# Patient Record
Sex: Male | Born: 1960 | Race: Black or African American | Hispanic: No | Smoking: Never smoker
Health system: Southern US, Community
[De-identification: ages and names within clinical notes are randomized; demographics above are authoritative.]

## PROBLEM LIST (undated history)

## (undated) DIAGNOSIS — I10 Essential (primary) hypertension: Secondary | ICD-10-CM

## (undated) DIAGNOSIS — E079 Disorder of thyroid, unspecified: Secondary | ICD-10-CM

## (undated) HISTORY — DX: Disorder of thyroid, unspecified: E07.9

## (undated) HISTORY — PX: KNEE SURGERY: SHX244

## (undated) HISTORY — DX: Essential (primary) hypertension: I10

## (undated) HISTORY — PX: HAND SURGERY: SHX662

## (undated) HISTORY — PX: HERNIA REPAIR: SHX51

---

## 2000-03-27 ENCOUNTER — Encounter: Payer: Self-pay | Admitting: Orthopedic Surgery

## 2000-03-27 ENCOUNTER — Ambulatory Visit (HOSPITAL_COMMUNITY): Admission: RE | Admit: 2000-03-27 | Discharge: 2000-03-27 | Payer: Self-pay | Admitting: Orthopedic Surgery

## 2000-04-12 ENCOUNTER — Encounter: Payer: Self-pay | Admitting: Orthopedic Surgery

## 2000-04-12 ENCOUNTER — Ambulatory Visit (HOSPITAL_COMMUNITY): Admission: RE | Admit: 2000-04-12 | Discharge: 2000-04-12 | Payer: Self-pay | Admitting: Orthopedic Surgery

## 2000-05-11 ENCOUNTER — Ambulatory Visit (HOSPITAL_COMMUNITY): Admission: RE | Admit: 2000-05-11 | Discharge: 2000-05-11 | Payer: Self-pay | Admitting: Orthopedic Surgery

## 2000-05-11 ENCOUNTER — Encounter: Payer: Self-pay | Admitting: Orthopedic Surgery

## 2002-07-31 ENCOUNTER — Encounter: Payer: Self-pay | Admitting: Neurosurgery

## 2002-07-31 ENCOUNTER — Ambulatory Visit (HOSPITAL_COMMUNITY): Admission: RE | Admit: 2002-07-31 | Discharge: 2002-07-31 | Payer: Self-pay | Admitting: Neurosurgery

## 2002-09-18 ENCOUNTER — Encounter
Admission: RE | Admit: 2002-09-18 | Discharge: 2002-11-05 | Payer: Self-pay | Admitting: Physical Medicine & Rehabilitation

## 2002-12-09 ENCOUNTER — Encounter
Admission: RE | Admit: 2002-12-09 | Discharge: 2003-03-09 | Payer: Self-pay | Admitting: Physical Medicine & Rehabilitation

## 2003-03-27 ENCOUNTER — Encounter
Admission: RE | Admit: 2003-03-27 | Discharge: 2003-06-25 | Payer: Self-pay | Admitting: Physical Medicine & Rehabilitation

## 2003-06-05 ENCOUNTER — Ambulatory Visit (HOSPITAL_BASED_OUTPATIENT_CLINIC_OR_DEPARTMENT_OTHER): Admission: RE | Admit: 2003-06-05 | Discharge: 2003-06-05 | Payer: Self-pay | Admitting: Orthopedic Surgery

## 2003-07-28 ENCOUNTER — Encounter
Admission: RE | Admit: 2003-07-28 | Discharge: 2003-10-26 | Payer: Self-pay | Admitting: Physical Medicine & Rehabilitation

## 2003-11-16 ENCOUNTER — Encounter
Admission: RE | Admit: 2003-11-16 | Discharge: 2004-02-14 | Payer: Self-pay | Admitting: Physical Medicine & Rehabilitation

## 2004-01-06 ENCOUNTER — Ambulatory Visit (HOSPITAL_COMMUNITY): Admission: RE | Admit: 2004-01-06 | Discharge: 2004-01-06 | Payer: Self-pay | Admitting: Anesthesiology

## 2004-02-11 ENCOUNTER — Encounter
Admission: RE | Admit: 2004-02-11 | Discharge: 2004-03-01 | Payer: Self-pay | Admitting: Physical Medicine & Rehabilitation

## 2004-03-01 ENCOUNTER — Encounter
Admission: RE | Admit: 2004-03-01 | Discharge: 2004-05-30 | Payer: Self-pay | Admitting: Physical Medicine & Rehabilitation

## 2004-03-03 ENCOUNTER — Ambulatory Visit: Payer: Self-pay | Admitting: Physical Medicine & Rehabilitation

## 2004-04-05 ENCOUNTER — Ambulatory Visit: Payer: Self-pay | Admitting: Physical Medicine & Rehabilitation

## 2004-05-05 ENCOUNTER — Encounter
Admission: RE | Admit: 2004-05-05 | Discharge: 2004-07-07 | Payer: Self-pay | Admitting: Physical Medicine & Rehabilitation

## 2004-06-29 ENCOUNTER — Encounter
Admission: RE | Admit: 2004-06-29 | Discharge: 2004-09-23 | Payer: Self-pay | Admitting: Physical Medicine & Rehabilitation

## 2004-08-16 ENCOUNTER — Encounter
Admission: RE | Admit: 2004-08-16 | Discharge: 2004-11-14 | Payer: Self-pay | Admitting: Physical Medicine & Rehabilitation

## 2004-08-31 ENCOUNTER — Ambulatory Visit: Payer: Self-pay | Admitting: Physical Medicine & Rehabilitation

## 2004-09-23 ENCOUNTER — Encounter
Admission: RE | Admit: 2004-09-23 | Discharge: 2004-12-22 | Payer: Self-pay | Admitting: Physical Medicine & Rehabilitation

## 2004-12-23 ENCOUNTER — Encounter
Admission: RE | Admit: 2004-12-23 | Discharge: 2005-03-23 | Payer: Self-pay | Admitting: Physical Medicine & Rehabilitation

## 2005-01-17 ENCOUNTER — Ambulatory Visit: Payer: Self-pay | Admitting: Anesthesiology

## 2005-05-01 ENCOUNTER — Encounter
Admission: RE | Admit: 2005-05-01 | Discharge: 2005-07-30 | Payer: Self-pay | Admitting: Physical Medicine & Rehabilitation

## 2005-05-01 ENCOUNTER — Ambulatory Visit: Payer: Self-pay | Admitting: Physical Medicine & Rehabilitation

## 2005-07-07 ENCOUNTER — Ambulatory Visit: Payer: Self-pay | Admitting: Physical Medicine & Rehabilitation

## 2005-08-02 ENCOUNTER — Encounter
Admission: RE | Admit: 2005-08-02 | Discharge: 2005-10-31 | Payer: Self-pay | Admitting: Physical Medicine & Rehabilitation

## 2005-09-04 ENCOUNTER — Ambulatory Visit: Payer: Self-pay | Admitting: Physical Medicine & Rehabilitation

## 2005-10-11 ENCOUNTER — Ambulatory Visit: Payer: Self-pay | Admitting: Physical Medicine & Rehabilitation

## 2005-10-11 ENCOUNTER — Encounter
Admission: RE | Admit: 2005-10-11 | Discharge: 2006-01-09 | Payer: Self-pay | Admitting: Physical Medicine & Rehabilitation

## 2005-11-30 ENCOUNTER — Ambulatory Visit: Payer: Self-pay | Admitting: Physical Medicine & Rehabilitation

## 2006-01-11 ENCOUNTER — Encounter
Admission: RE | Admit: 2006-01-11 | Discharge: 2006-04-11 | Payer: Self-pay | Admitting: Physical Medicine & Rehabilitation

## 2006-01-11 ENCOUNTER — Ambulatory Visit: Payer: Self-pay | Admitting: Physical Medicine & Rehabilitation

## 2006-03-08 ENCOUNTER — Ambulatory Visit: Payer: Self-pay | Admitting: Physical Medicine & Rehabilitation

## 2006-06-21 ENCOUNTER — Encounter
Admission: RE | Admit: 2006-06-21 | Discharge: 2006-09-19 | Payer: Self-pay | Admitting: Physical Medicine & Rehabilitation

## 2006-06-29 ENCOUNTER — Ambulatory Visit: Payer: Self-pay | Admitting: Physical Medicine & Rehabilitation

## 2006-07-30 ENCOUNTER — Ambulatory Visit: Payer: Self-pay | Admitting: Physical Medicine & Rehabilitation

## 2006-08-24 ENCOUNTER — Encounter
Admission: RE | Admit: 2006-08-24 | Discharge: 2006-11-16 | Payer: Self-pay | Admitting: Physical Medicine & Rehabilitation

## 2006-09-25 ENCOUNTER — Ambulatory Visit: Payer: Self-pay | Admitting: Physical Medicine & Rehabilitation

## 2006-11-16 ENCOUNTER — Encounter
Admission: RE | Admit: 2006-11-16 | Discharge: 2007-02-14 | Payer: Self-pay | Admitting: Physical Medicine & Rehabilitation

## 2006-11-22 ENCOUNTER — Ambulatory Visit: Payer: Self-pay | Admitting: Physical Medicine & Rehabilitation

## 2007-02-18 ENCOUNTER — Encounter
Admission: RE | Admit: 2007-02-18 | Discharge: 2007-03-20 | Payer: Self-pay | Admitting: Physical Medicine & Rehabilitation

## 2007-02-18 ENCOUNTER — Ambulatory Visit: Payer: Self-pay | Admitting: Physical Medicine & Rehabilitation

## 2007-05-09 ENCOUNTER — Ambulatory Visit: Payer: Self-pay | Admitting: Physical Medicine & Rehabilitation

## 2007-05-15 ENCOUNTER — Encounter
Admission: RE | Admit: 2007-05-15 | Discharge: 2007-05-15 | Payer: Self-pay | Admitting: Physical Medicine & Rehabilitation

## 2007-07-10 ENCOUNTER — Ambulatory Visit: Payer: Self-pay | Admitting: Physical Medicine & Rehabilitation

## 2007-07-11 ENCOUNTER — Encounter
Admission: RE | Admit: 2007-07-11 | Discharge: 2007-10-09 | Payer: Self-pay | Admitting: Physical Medicine & Rehabilitation

## 2007-09-06 ENCOUNTER — Ambulatory Visit: Payer: Self-pay | Admitting: Physical Medicine & Rehabilitation

## 2007-11-07 ENCOUNTER — Encounter
Admission: RE | Admit: 2007-11-07 | Discharge: 2007-11-11 | Payer: Self-pay | Admitting: Physical Medicine & Rehabilitation

## 2007-11-11 ENCOUNTER — Ambulatory Visit: Payer: Self-pay | Admitting: Physical Medicine & Rehabilitation

## 2007-12-11 ENCOUNTER — Encounter
Admission: RE | Admit: 2007-12-11 | Discharge: 2007-12-11 | Payer: Self-pay | Admitting: Physical Medicine & Rehabilitation

## 2007-12-11 ENCOUNTER — Ambulatory Visit: Payer: Self-pay | Admitting: Physical Medicine & Rehabilitation

## 2008-02-06 ENCOUNTER — Encounter
Admission: RE | Admit: 2008-02-06 | Discharge: 2008-02-10 | Payer: Self-pay | Admitting: Physical Medicine & Rehabilitation

## 2008-02-10 ENCOUNTER — Ambulatory Visit: Payer: Self-pay | Admitting: Physical Medicine & Rehabilitation

## 2008-03-11 ENCOUNTER — Encounter
Admission: RE | Admit: 2008-03-11 | Discharge: 2008-03-11 | Payer: Self-pay | Admitting: Physical Medicine & Rehabilitation

## 2008-03-11 ENCOUNTER — Ambulatory Visit: Payer: Self-pay | Admitting: Physical Medicine & Rehabilitation

## 2008-07-17 ENCOUNTER — Encounter
Admission: RE | Admit: 2008-07-17 | Discharge: 2008-10-15 | Payer: Self-pay | Admitting: Physical Medicine & Rehabilitation

## 2008-07-20 ENCOUNTER — Ambulatory Visit: Payer: Self-pay | Admitting: Physical Medicine & Rehabilitation

## 2008-11-06 ENCOUNTER — Encounter
Admission: RE | Admit: 2008-11-06 | Discharge: 2008-11-09 | Payer: Self-pay | Admitting: Physical Medicine & Rehabilitation

## 2008-11-09 ENCOUNTER — Ambulatory Visit: Payer: Self-pay | Admitting: Physical Medicine & Rehabilitation

## 2009-01-28 ENCOUNTER — Encounter
Admission: RE | Admit: 2009-01-28 | Discharge: 2009-04-28 | Payer: Self-pay | Admitting: Physical Medicine & Rehabilitation

## 2009-03-16 ENCOUNTER — Ambulatory Visit: Payer: Self-pay | Admitting: Physical Medicine & Rehabilitation

## 2009-07-12 ENCOUNTER — Encounter
Admission: RE | Admit: 2009-07-12 | Discharge: 2009-10-10 | Payer: Self-pay | Admitting: Physical Medicine & Rehabilitation

## 2009-09-01 ENCOUNTER — Ambulatory Visit: Payer: Self-pay | Admitting: Physical Medicine & Rehabilitation

## 2009-12-15 ENCOUNTER — Encounter
Admission: RE | Admit: 2009-12-15 | Discharge: 2009-12-22 | Payer: Self-pay | Admitting: Physical Medicine & Rehabilitation

## 2009-12-22 ENCOUNTER — Ambulatory Visit: Payer: Self-pay | Admitting: Physical Medicine & Rehabilitation

## 2010-03-11 ENCOUNTER — Encounter
Admission: RE | Admit: 2010-03-11 | Discharge: 2010-04-13 | Payer: Self-pay | Source: Home / Self Care | Attending: Physical Medicine & Rehabilitation | Admitting: Physical Medicine & Rehabilitation

## 2010-04-13 ENCOUNTER — Ambulatory Visit: Payer: Self-pay | Admitting: Physical Medicine & Rehabilitation

## 2010-08-30 ENCOUNTER — Ambulatory Visit: Payer: Self-pay | Admitting: Physical Medicine & Rehabilitation

## 2010-09-26 ENCOUNTER — Encounter: Payer: BC Managed Care – PPO | Attending: Physical Medicine & Rehabilitation

## 2010-09-26 ENCOUNTER — Ambulatory Visit: Payer: BC Managed Care – PPO | Admitting: Physical Medicine & Rehabilitation

## 2010-09-26 DIAGNOSIS — I10 Essential (primary) hypertension: Secondary | ICD-10-CM | POA: Insufficient documentation

## 2010-09-26 DIAGNOSIS — M545 Low back pain, unspecified: Secondary | ICD-10-CM | POA: Insufficient documentation

## 2010-09-26 DIAGNOSIS — M129 Arthropathy, unspecified: Secondary | ICD-10-CM | POA: Insufficient documentation

## 2010-09-26 DIAGNOSIS — M47814 Spondylosis without myelopathy or radiculopathy, thoracic region: Secondary | ICD-10-CM

## 2010-09-26 DIAGNOSIS — G8929 Other chronic pain: Secondary | ICD-10-CM | POA: Insufficient documentation

## 2010-09-26 DIAGNOSIS — M5380 Other specified dorsopathies, site unspecified: Secondary | ICD-10-CM

## 2010-09-26 DIAGNOSIS — IMO0002 Reserved for concepts with insufficient information to code with codable children: Secondary | ICD-10-CM

## 2010-10-31 NOTE — Assessment & Plan Note (Signed)
Phillip Merritt is back regarding his chronic low back pain.  Pain is about a 4- 6/10 and they think it is doing a bit better.  His biggest complaint is low back pain usually with walking.  He tries some low back activities. He works on his Pilates at [employer] and seems to be finding some relief with that.  He also has backed off his weight a bit at the gym.  He tells me that he is very unsatisfied with his job and feels that has lots of stress that effects him in a negative way.  REVIEW OF SYSTEMS:  Notable for the above.  Full 12-point review is written health and history section of the chart.  SOCIAL HISTORY:  Unchanged.  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  PHYSICAL EXAMINATION:  VITAL SIGNS:  Blood pressure is 167/96, pulse 70, respiratory rate 18, sating 100% on room air. GENERAL:  The patient is generally pleasant, alert and oriented x3.  He is well dressed.  Posture is good.  Strength is 5/5.  Normal reflexes and sensation throughout. BACK:  Range of motion is generally stable with pain more with extension than in flexion today.  HEART:  Regular. CHEST:  Clear. ABDOMEN:  Soft, nontender.  ASSESSMENT: 1. Lumbar facet arthropathy. 2. Hypertension.  PLAN: 1. Consider looking at gravity table for weight assisted traction for     low back.  He tells me that he has already looked at this to a     certain extent.  He can try some exercises on his own at the gym to     see how this affects his low back. 2. Keep with his exercises that he is doing.  He seemed to be     appropriate and he was protecting his back with these. 3. Again, we discussed weaning his hydrocodone.  I think our goal     would be to get 30-60 per month. 4. Also, discussed job satisfaction and I think this is likely playing     a large role in his continued pain complaints. 5. I will see the patient back in about 6 months' time.     Ranelle Oyster, M.D. Electronically  Signed    ZTS/MedQ D:  09/26/2010 12:41:23  T:  09/26/2010 22:25:18  Job #:  [id number]

## 2010-11-15 NOTE — Assessment & Plan Note (Signed)
Phillip Merritt is back regarding his back. He has been doing generally well. His  pain ranges from a 3 to 6 out of 10. Pain is most prominent when he is  active such as playing sports, etc. Pain is sharp, stabbing, and aching  when it bothers him. Pain is interfering with his general activities,  relationships with others, and enjoyment of life of an moderate level.  Sleep is fair.   REVIEW OF SYSTEMS:  Notable for the above.   SOCIAL HISTORY:  Without change. 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   PHYSICAL EXAMINATION:  Blood pressure is 127/86, pulse is 70,  respiratory rate is 18, he is sating at 99% in room air. Patient is  pleasant, alert and oriented x3. Affect is bright and appropriate. Gait  is stable. He has some pain still with extension and facet maneuvers in  the lumbar spine but is moving well. He is able to touch his toes today.  Muscle strength is 5 out of 5 and sensory exam is normal.  HEART: Regular.  CHEST: Clear.  ABDOMEN: Soft, nontender.   ASSESSMENT:  Lumbar facet arthropathy.   PLAN:  1. Continue exercise and range of motion as described.  2. Refill fentanyl patch 25 mcg for this month and next.  3. Refill hydrocodone 5/500 for this month and next.  4. He will see the nurse clinic in 2 months and me in 4 months time.      Ranelle Oyster, M.D.  Electronically Signed     ZTS/MedQ  D:  03/20/2007 12:56:30  T:  03/20/2007 16:06:44  Job #:  [id number]

## 2010-11-15 NOTE — Procedures (Signed)
NAME:  Phillip Merritt, Phillip Merritt                ACCOUNT NO.:  [id number]   MEDICAL RECORD NO.:  [id number]          PATIENT TYPE:  REC   LOCATION:  [facility]                          FACILITY:  [facility]   PHYSICIAN:  Erick Colace, M.D.DATE OF BIRTH:  07/04/1960   DATE OF PROCEDURE:  DATE OF DISCHARGE:                               OPERATIVE REPORT   PROCEDURES:  1. Left L3 and L4 medial branch radiofrequency neurotomy.  2. Left L5 dorsal ramus radiofrequency neurotomy under lumbar and      sacral fluoroscopic guidance.   INDICATIONS:  Lumbar spondylosis without myelopathy with facet mediated  pain.  He has had excellent response to repeat medial branch blocks  performed, Nov 11, 2007.  He really went down to zero pain.  He did have  prolonged response with radiofrequency neurotomy, last done  approximately 18 months ago.  Pain does interfere with activity at  moderate level.   The pain persists despite medication management including narcotic and  analgesic medications.   PROCEDURE:  Informed consent was obtained after describing risks and  benefits of the procedure with the patient.  These include bleeding,  bruising, and infection.  He elects to proceed and has given a written  consent.  The patient placed prone on fluoroscopy table.  Betadine prep,  sterilely drape. A 25-gauge 1-1/2 inch needle was used to anesthetize  the skin and subcutaneous tissue, 1% lidocaine x2 mL.  Then, a 20-gauge  10-cm RF needle with 10 mm curved active tip was inserted under  fluoroscopic guidance first starting at left S1 SAP sacroiliac junction.  Bone contact was made and confirmed with lateral imaging.  Sensory stim  at 50 Hz followed by motor stim at 2 Hz confirmed proper needle location  followed by injection of 1 mL of solution containing 1 mL of 4 mg  dexamethasone and 2 mL of 1% MPF lidocaine followed by radiofrequency  lesioning 70 degrees Celsius for 70 seconds into the left L5 SAP  transverse process  junction targeted.  Bone contact made and confirmed  with lateral imaging.  Sensory stim at 50 Hz followed by motor stim at 2  Hz confirmed proper needle location followed by injection of 1 mL of  dexamethasone-lidocaine solution and radiofrequency lesioning 70 degrees  Celsius for 70 seconds in the left L4 SAP transverse process junction  targeted.  Bone contact made and confirmed with lateral imaging.  Omnipaque 180 x 0.5 mL demonstrated no intravascular uptake and then 50  Hz stim followed by 2 Hz stim  confirmed proper needle location followed  by radiofrequency lesioning 70 degrees for 70 seconds.  The patient  tolerated the procedure well.  Pre- and postinjection vitals stable.  Post injection instructions given.  He will follow up with Dr. Riley Kill in  1 month.       Erick Colace, M.D.  Electronically Signed     AEK/MEDQ  D:  02/10/2008 17:21:22  T:  02/11/2008 06:10:42  Job:  [id number]

## 2010-11-15 NOTE — Assessment & Plan Note (Signed)
Phillip Merritt is back regarding his low back pain.  His pain seems to have  increased over the last few months to a 5-7/10.  Back is most painful on  the left lower lumbar segment without real radiation into the legs.  Pain is more bothersome when he is up and active on his feet, walking  and standing for prolonged periods of time.  He has continued to be  active with aerobic exercising and light weight lifting at the gym.  He  stays away from golf at this point as it seems to exacerbate his  symptoms greatly.  The pain is described as sharp and stabbing.  His  Fentanyl patch does not seem to cover pain as well as it once did.  He  uses hydrocodone for breakthrough pain.  Sleep is fair.   REVIEW OF SYSTEMS:  Notable for the above with full review in the  written health and history section of the chart.   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   PHYSICAL EXAMINATION:  Blood pressure is 127/78, pulse 74, respiratory  rate 18.  He is satting 98% on room air.  Patient is pleasant, alert and  oriented x3.  Affect is bright and appropriate.  Gait generally looks  stable.  He continues to have pain in the left lower lumbar segments,  particularly L4-L5 and L5-S1 with extension and left-sided facet  maneuvers.  He had good lumbar flexion today.  He was able to touch his  toes with minimal discomfort.  Strength is 5/5 both legs.  Sensation is  normal.  Reflexes are 2+.  HEART:  Regular.  CHEST:  Clear.  ABDOMEN:  Soft, nontender.  The patient's weight is generally stable.   ASSESSMENT:  Lumbar facet arthropathy.   PLAN:  1. It has been about two years since he last had his medial branch      block/RFs.  We will send him back to Dr. Wynn Banker for repeat L4-      L5, L5-S1 medial branch blocks.  2. Continue exercise and range of motion as we have discussed.  I      asked him to avoid a lot of axial loading and twisting with weight  lifting if possible.  3. Refill Fentanyl patch and hydrocodone.  4. I will see him back pending the above.      Ranelle Oyster, M.D.  Electronically Signed     ZTS/MedQ  D:  09/09/2007 12:33:37  T:  09/09/2007 20:18:19  Job #:  [id number]

## 2010-11-15 NOTE — Assessment & Plan Note (Signed)
Phillip Merritt is back regarding his back pain.  He has had great results with  the medial branch blocks.  He had 0/10 pain for a while.  His pain is at  4/10 now.  Described as aching and dull.  He uses his fentanyl patch and  hydrocodone for breakthrough symptoms.   REVIEW OF SYSTEMS:  Essentially is unchanged.  He denies any leg  symptoms.  Bowel and bladder functions are intact.   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   PHYSICAL EXAMINATION:  VITAL SIGNS:  Blood pressure is 136/94, pulse is  87, and respiratory rate 18.  He is saturating 99% on room air.  GENERAL:  The patient is a pleasant, alert, and oriented x3.  EXTREMITIES:  He has positive facet maneuvers, left and right today.  Good lumbar flexion ability noted today.  Strength is 5/5 with normal  sensation.  Overall, his back is less tender.  HEART:  Regular.  CHEST:  Clear.  ABDOMEN:  Soft, nontender.   ASSESSMENT:  Lumbar facet arthropathy.   PLAN:  1. Return to Dr. Wynn Banker for radiofrequency ablation, as he has had      excellent response to medial branch blocks.  2. Consider tapering off fentanyl over this summer, depending on      response to RF.  3. I refilled fentanyl patches 25 mcg and hydrocodone 5/500 today, #10      and 90 respectively.      Ranelle Oyster, M.D.  Electronically Signed     ZTS/MedQ  D:  12/11/2007 13:25:01  T:  12/12/2007 06:56:14  Job #:  [id number]

## 2010-11-15 NOTE — Assessment & Plan Note (Signed)
Phillip Merritt is doing about the same as related to his low back.  He has his  flares when he overdoes it with activity but generally has been doing  very well.  He continues to work full-time.  He rates his pain on  average of 4 out of 10 with increases to 7 out of 10 when he flares the  back.  The pain is described as sharp, constant, aching in the left low  back area.  The pain interferes with general activity, relations with  others and enjoyment of life on a moderate level.  Sleep is fair.   REVIEW OF SYSTEMS:  Is notable for the above.  Full review is in the  health and history section in chart.   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  Denies smoking or drinking.   PHYSICAL EXAMINATION:  Blood pressure is 145/94.  Pulse is 70,  respiratory rate 18, sating on 99% on room air.  The patient is generally pleasant, alert and oriented x3.  Affect is  bright and appropriate. His gait is stable.  Low back range of motion is unchanged with some pain in extension and  with facet maneuvers today.  HEART:  Regular.  CHEST:  Clear.  ABDOMEN:  Soft, nontender.  Motor function is 5/5.  Sensory exam is intact.   ASSESSMENT:  Lumbar facet arthropathy.   PLAN:  1. Continue exercise with stretching and strengthening. He is to      continue working on his core muscle strength.  I want to see him be      active.  If he knows he is going to overdo it or be involved with      something that is physically overtaxing he needs to consider      prophylaxing with an anti-inflammatory.  I recommended over-the-      counter naproxen 2 tablets q.12 hours to 8 hours surrounding such      activity.  2. Refill of fentanyl patch 25 mcg q.72 hours with a refill for next      month.  3. Hydrocodone 5/500 one q.8 hours p.r.n. with refill.  4. I will see the patient back in 4 months' time.  He will see the      nurse clinic in 2 months.      Ranelle Oyster, M.D.  Electronically Signed     ZTS/MedQ  D:  12/24/2006 13:02:07  T:  12/24/2006 15:26:01  Job #:  [id number]

## 2010-11-15 NOTE — Assessment & Plan Note (Signed)
Phillip Merritt is back regarding his back pain.  He has been doing fairly  well over the last few months.  He is off fentanyl currently.  He is  using 1 or 2 hydrocodone for breakthrough pain.  He tries to exercise  and stretch.  Pain is most prominent is his back and seems to be most  noticeable after prolonged standing, walking, or sitting.  He rates the  pain at 4-5/10.  He had lumbar RFs in August 2009.   REVIEW OF SYSTEMS:  Notable for the above.  Full review is in the  written health and history section of the chart.   SOCIAL HISTORY:  Unchanged and he is still working full-time.   PHYSICAL EXAMINATION:  VITAL SIGNS:  Blood pressure is 165/103, pulse  80, respiratory rate 18.  He is satting 98% on room air.  GENERAL:  The patient is pleasant, alert, oriented x3.  EXTREMITIES:  Affect is bright and appropriate.  He had good flexion and  extension with minimal pain today.  He really had no pain with  provocative facet maneuvers.  Strength 5/5 in both legs with 2+ reflexes  to sensation.  HEART:  Regular.  CHEST:  Clear.  ABDOMEN:  Soft, nontender.   ASSESSMENT:  1. Lumbar facet arthropathy with good response to radiofrequency      ablation.  2. Hypertension.   PLAN:  1. Continue breakthrough hydrocodone use.  Encouraged modalities,      stretching, anti-inflammatory, etc.  2. Follow up with PCP regarding blood pressure.      Ranelle Oyster, M.D.  Electronically Signed     ZTS/MedQ  D:  07/20/2008 11:33:10  T:  07/21/2008 01:16:48  Job #:  [id number]

## 2010-11-15 NOTE — Assessment & Plan Note (Signed)
Phillip Merritt is back regarding his low back pain.  Pain is increased a bit  over the last few months to a 5-6/10.  He had a flare yesterday when he  was sitting around the house did not report any type of exacerbating  activity.  Pain is aching and sharp usually in the left and right low  back.  Pain interferes with general activity, in relations with others,  and enjoyment of life on a moderate level.  He does try to stay active  and went to the gym doing some stretching daily and some light lifting.   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  No other social changes are noted.   REVIEW OF SYSTEMS:  Notable for the above.  Full 14-point review is in  the written health and history section of the chart.   PHYSICAL EXAMINATION:  VITAL SIGNS:  Blood pressure is 148/93, pulse is  102, respiratory rate 16.  He is sating 98% on room air.  GENERAL:  The patient is pleasant, alert, and oriented x3.  EXTREMITIES:  He is able to bend and even touch his toes today.  He had  mild pain with facet maneuvers in right and left today.  Extension  caused some mild discomfort as well.  Strength remains 5/5 with normal  reflexes and range of motion in both lower extremities.  HEART:  Regular.  CHEST:  Clear.  ABDOMEN:  Soft, nontender.  Weight is stable.   ASSESSMENT:  1. Lumbar facet arthropathy.  2. Hypertension.   PLAN:  1. We talked about some prophylactic steps before and after activities      such as ibuprofen or any anti-inflammatory.  We also add Flexeril      for spasm p.r.n. for bad days.  Continues to keep up with      activities days where he has downtime.  I think regular aerobic      activity and stretching is imperative for him in the maintenance of      his back.  Held off on any imminent intervention for the back at      this point and see how he does with conservative management.  We      discussed some alternative techniques with  his exercising as well      that may help take some stress off his back.  2. I refilled his Vicodin 5/500 one q.6 h p.r.n. #90.  3. I will see him back in 3 months.       Ranelle Oyster, M.D.  Electronically Signed     ZTS/MedQ  D:  11/09/2008 09:47:31  T:  11/10/2008 00:07:59  Job #:  [id number]

## 2010-11-15 NOTE — Procedures (Signed)
NAME:  Phillip Merritt, Phillip Merritt                ACCOUNT NO.:  [id number]   MEDICAL RECORD NO.:  [id number]          PATIENT TYPE:  REC   LOCATION:  [facility]                          FACILITY:  [facility]   PHYSICIAN:  Erick Colace, M.D.DATE OF BIRTH:  Mar 12, 1961   DATE OF PROCEDURE:  11/11/2007  DATE OF DISCHARGE:                               OPERATIVE REPORT   PROCEDURE:  Bilateral L5 dorsal ramus injection, bilateral L4 medial  branch block, bilateral L3 medial branch block under fluoroscopic  guidance.   INDICATIONS:  Lumbar facet mediated pain.  He had about an 75-month  relief of back pain with lumbar RF at L4-5, L5-S1 levels.  He has  gradually increased pain over last six months.   Pain is only partially responsive to management and other conservative  care.  Pain medications include hydrocodone and Fentanyl patch.  Pain  interferes with activity including ambulation and bending.   Informed consent was obtained after describing risks and benefits of the  procedure with the patient.  These include bleeding, bruising,  infection, temporary or permanent paralysis, elects to proceed and has  given consent.  Patient placed prone on the fluoroscopy table.  Betadine  prep, sterile drape, 25-gauge inch and a half needle was used to  anesthetize skin and subcutaneous tissue 1% lidocaine x2 mL.  Then a 22-  gauge three and a half inch spinal needle was inserted under  fluoroscopic guidance, first targeting left S1 SAP sacral iliac  junction, bone contact made, confirmed with lateral imaging.  Omnipaque  180 x0.5 mL demonstrated no intravascular uptake.  Then 0.5 mL of a  solution containing 1 mL of 4 mg/mL dexamethasone and 2 mL 2% MPF  lidocaine were injected.  The left L5 SAP transverse process junction  targeted, bone contact made, confirmed with lateral imaging.  Omnipaque  180 x0.5 mL demonstrated no intravascular uptake.  Then 0.5 mL of the  dexamethasone lidocaine solution was injected.   Then the left L4 SAP  transverse process junction targeted, bone contact made, confirmed with  lateral imaging.  Omnipaque 180 x0.5 mL demonstrated no intravascular  uptake.  Then 0.5 mL dexamethasone lidocaine solution injected.  This  same procedure was repeated at corresponding levels on the right side  with same needle, injectate and technique.  The patient tolerated  procedure well.  Pre- and post injection vitals stable.   Pre-injection and post injection 4/10 pain.  He will follow up with Dr.  Riley Kill to see if pain improves over time.  If not  significantly better,  consider SI joint.  Diskogenic pain is also in the differential.      Erick Colace, M.D.  Electronically Signed     AEK/MEDQ  D:  11/11/2007 14:38:18  T:  11/11/2007 15:33:18  Job:  [id number]

## 2010-11-15 NOTE — Assessment & Plan Note (Signed)
Phillip Merritt is back regarding his chronic lumbar facet arthropathy.  He had  lumbar radiofrequency ablations by Dr. Wynn Banker last month and has had  great results.  He used to stretch out his fentanyl patch to every 4  days.  He is using less hydrocodone.  His pain today is 2/10.  He has  done some exercise, he has played a little bit of golf, etc.  He has to  watch that he does not overdue at the time but otherwise he is very  happy with the results.   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   REVIEW OF SYSTEMS:  Negative other than that listed above.  Full review  is in the written health and history section on the chart.   PHYSICAL EXAMINATION:  VITAL SIGNS:  Blood pressure is 158/85, pulse is  82, respiratory rate 18, and he is saturating 100% on room air.  GENERAL:  The patient is pleasant, alert and oriented x3.  Affect is  bright and appropriate.  Strength is 5/5 in all 4 extremities.  Coordination, reflexes, and sensation are normal.  HEART:  Regular.  CHEST:  Clear.  ABDOMEN:  Soft and nontender.  BACK:  Slightly tender with extension but minimally so.  He has good  flexion in the lumbar spine.  Weight and posture are good.   ASSESSMENT:  Lumbar facet arthropathy.  Responsive again to  radiofrequency ablation.   PLAN:  1. We will stop fentanyl patch as he is only taking every 4 days now.  2. We refill the hydrocodone 5/500 #90 with an intent of decreasing      usage of this as well.  I encourage heat, stretching, core muscle      strengthening, Tylenol and ibuprofen for pain relief.  3. I will see him back in about 4 months.      Ranelle Oyster, M.D.  Electronically Signed     ZTS/MedQ  D:  03/11/2008 13:04:45  T:  03/12/2008 02:07:09  Job #:  [id number]

## 2010-11-18 NOTE — Op Note (Signed)
NAME:  Phillip Merritt, Phillip Merritt                          ACCOUNT NO.:  [id number]   MEDICAL RECORD NO.:  [id number]                   PATIENT TYPE:  AMB   LOCATION:  DSC                                  FACILITY:  [facility]   PHYSICIAN:  Harvie Junior, M.D.                DATE OF BIRTH:  23-Aug-1960   DATE OF PROCEDURE:  06/05/2003  DATE OF DISCHARGE:  06/05/2003                                 OPERATIVE REPORT   PREOPERATIVE DIAGNOSIS:  Malunion left fourth metacarpal, nonunion left  fifth metacarpal.   POSTOPERATIVE DIAGNOSIS:  Malunion left fourth metacarpal, nonunion left  fifth metacarpal.   PROCEDURE:  1. Open reduction and internal fixation of left malunited fourth metacarpal.  2. Open reduction and internal fixation of left fifth nonunited metacarpal.   SURGEON:  Harvie Junior, M.D.   ASSISTANT:  Marshia Ly, P.A.   ANESTHESIA:  General.   BRIEF HISTORY:  He is a 50 year old male with a long history of having a  basilar fourth and fifth metacarpal fracture.  I ultimately saw him in the  office and talked about treatment including the possibility of operative  treatment.  I felt that this was conservative care.  He ultimately elected  to undergo closed treatment and this was performed.  Following this, the  patient finished closed treatment.  He had no rotational malalignment.  He  did have some angular deformity and was thought to be doing reasonably well.  Unfortunately, he continued to have significant pain in his fourth and fifth  metacarpal.  Ultimately because of continued complaints of pain and  increasing pain, a CT scan was obtained which showed that he had a nonunited  fifth metacarpal and a malunited fourth metacarpal.  We talked about  treatment options at that point but ultimately felt that the most  appropriate course of action given his continued pain was going to be a take  down of his nonunion of his fifth metacarpal and essentially an osteotomy of  his fourth  metacarpal to establish straight finger.  He is brought to the  operating room for these procedures.   DESCRIPTION OF PROCEDURE:  The patient was taken to the operating room and  after adequate anesthesia with general endotracheal, the patient was placed  supine on the operating table.  The left arm was prepped and draped in the  usual sterile fashion.  Following this, a linear incision was made between  the fourth and fifth metacarpals.  The subcutaneous tissue were taken down  to the level of the fourth and fifth metacarpal and through a window, the  fifth metacarpal was identified initially.  The area of nonunion could be  identified and this was taken down and could be easily reduced.  Through a  separate window with separate retraction of the extensor tendon and through  the periosteal window to address the fourth metacarpal, the fourth  metacarpal was  united, although malunited.  At this point, essentially a  wedge osteotomy was undertaken to reduce the angular deformity of the fourth  metacarpal.  Once this was undertaken, the Leibinger hand set was used and a  Symphony bone graft was mixed up on the back table.   At this point, attention was turned to the fourth metacarpal where the  fourth  metacarpal was addressed with Symphony bone grafting after freeing  up the edges of the osteotomy site and a Leibinger six hole T-plate was used  with three screws in the proximal fragment and three screws distally with a  single screw open.  Excellent fixation was achieved with the small titanium  plate.  Attention was turned to closing this fourth window over the  Leibinger plate and attention was turned to the fifth metacarpal.  At this  point, a separate Leibinger plate was attempted to be used but really I did  not think we could get purchase with the proximal screws and so the plate  was put away.  Attention was turned towards bone grafting.  The fifth  metacarpal holding the finger in a  reduced position, making sure that the  rotation alignment of the fourth and fifth metacarpal were OK and then we  pinned the fifth metacarpal with two 62 K-wires to hold this in place.  Once  this was achieved, the periosteal window was closed over the fifth  metacarpal.  The wound, at this point, was copiously irrigated and suctioned  dry.  The subcu was closed with 3-0 Vicryl and the skin with 3-0 Maxon pull  out suture.  Benzoin and Steri-Strips were applied.  A sterile compressive  dressing was applied as well as an ulnar gutter splint with the fingers at  70 degrees of flexion and the wrist in slight extension.  The tourniquet was  let down, total tourniquet time was approximately two hours.                                               Harvie Junior, M.D.    Ranae Plumber  D:  06/08/2003  T:  06/09/2003  Job:  [id number]

## 2010-11-18 NOTE — Assessment & Plan Note (Signed)
Phillip Merritt is back regarding his low back pain.  He tried to wean down his  Vicodin a bit over the summer and has been a bit better.  He using 2-3 a  day.  Pain ranges from 4-7/10.  He had been active playing golf and  trying to change the way he swings.  Cares for his back while he is  playing.  Pain is aching.  Pain in the left low back region without  radiation into the legs.   REVIEW OF SYSTEMS:  Notable for the above.  Full 14 point review is in  the written health and history section of the chart.   SOCIAL HISTORY:  Unchanged.  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   PHYSICAL EXAMINATION:  Blood pressure 150/92, pulse is 93, respiratory  rate is 16, and saturating 98% on room air.  The patient is pleasant,  alert, and oriented x3.  Flexion and extension are fairly functional  today.  He has some mild pain with extension.  Strength is 5/5 in both  legs with 2+ reflexes and normal sensation.  Heart is regular.  Chest is  clear.  Abdomen is soft and nontender.   ASSESSMENT:  1. Lumbar facet arthropathy.  2. Hypertension.   PLAN:  1. Vicodin for breakthrough pain 2-3 times a day with Flexeril for      spasm p.r.n.  2. Discussed appropriate techniques and prophylaxis again.  3. I will see him back in 4 months.      Ranelle Oyster, M.D.  Electronically Signed     ZTS/MedQ  D:  03/16/2009 12:55:16  T:  03/17/2009 05:44:28  Job #:  [id number]

## 2011-03-28 ENCOUNTER — Encounter: Payer: BC Managed Care – PPO | Admitting: Physical Medicine & Rehabilitation

## 2011-04-11 ENCOUNTER — Encounter: Payer: BC Managed Care – PPO | Attending: Neurosurgery | Admitting: Neurosurgery

## 2011-04-11 DIAGNOSIS — I10 Essential (primary) hypertension: Secondary | ICD-10-CM | POA: Insufficient documentation

## 2011-04-11 DIAGNOSIS — M545 Low back pain, unspecified: Secondary | ICD-10-CM | POA: Insufficient documentation

## 2011-04-11 DIAGNOSIS — M5380 Other specified dorsopathies, site unspecified: Secondary | ICD-10-CM

## 2011-04-11 DIAGNOSIS — M129 Arthropathy, unspecified: Secondary | ICD-10-CM | POA: Insufficient documentation

## 2011-04-11 DIAGNOSIS — G8929 Other chronic pain: Secondary | ICD-10-CM | POA: Insufficient documentation

## 2011-04-11 NOTE — Assessment & Plan Note (Signed)
This is a patient of Dr. Arlyss Gandy who is seen for chronic low back pain.  He reports no worsening of his condition.  He states everything is about the same.  He does still take about 2-3 hydrocodone a day and is aware that Dr. Riley Kill wanted him to wean, but actually he is using minimal amount I think at this point.  His average pain is 7.  It is a sharp to aching-type pain.  General activity level is 5-6.  Pain is worse during the day.  Sleep patterns are fair.  Pain is also walking, bending, sitting, standing, and activities.  Medication and injections help.  MOBILITY:  He is independent.  She does work full time.  REVIEW OF SYSTEMS:  Notable for difficulties described above, otherwise within normal limits.  His Oswestry score is 34.  No signs of aberrant behaviors.  Last UDS was a year ago.  We will obtain one of those today.  PAST MEDICAL HISTORY:  Unchanged.  SOCIAL HISTORY:  Unchanged.  FAMILY HISTORY:  Unchanged.  PHYSICAL EXAM:  His blood pressure is 161/106.  He needs to see PCP about that.  Pulse 87, respirations 18, O2 sats 98 on room air.  His motor strength is 5/5 in lower extremities.  Sensation is intact. Constitutionally, he is within normal limits.  He is alert and oriented x3.  He does have somewhat of a limp to his gait.  ASSESSMENT: 1. Lumbar facet arthropathy. 2. Hypertension.  Should see primary care physician as soon as     possible.  PLAN: 1. Refill Vicodin 5/500 one p.o. q.6-8 h. p.r.n., #80 with no refill. 2. Flexeril 10 mg 1 p.o. q.8 h. p.r.n., #30 with 4 refills.  Obtain     UDS today. 3. He will follow up here in 6 months.  His questions were encouraged     and answered.     Phillip Merritt L. Blima Dessert Electronically Signed    RLW/MedQ D:  04/11/2011 10:22:46  T:  04/11/2011 15:50:49  Job #:  [id number]

## 2011-05-08 ENCOUNTER — Ambulatory Visit: Payer: BC Managed Care – PPO | Admitting: Physical Medicine & Rehabilitation

## 2011-11-07 ENCOUNTER — Encounter: Payer: BC Managed Care – PPO | Admitting: Physical Medicine & Rehabilitation

## 2011-11-24 ENCOUNTER — Ambulatory Visit
Admission: RE | Admit: 2011-11-24 | Discharge: 2011-11-24 | Disposition: A | Discharge status: Home / Self Care | Payer: BC Managed Care – PPO | Source: Ambulatory Visit | Attending: Family Medicine | Admitting: Family Medicine

## 2011-11-24 ENCOUNTER — Other Ambulatory Visit: Payer: Self-pay | Admitting: Family Medicine

## 2011-11-24 DIAGNOSIS — M25579 Pain in unspecified ankle and joints of unspecified foot: Secondary | ICD-10-CM

## 2011-12-11 ENCOUNTER — Encounter: Payer: Self-pay | Admitting: Physical Medicine and Rehabilitation

## 2011-12-11 ENCOUNTER — Encounter
Payer: BC Managed Care – PPO | Attending: Physical Medicine & Rehabilitation | Admitting: Physical Medicine and Rehabilitation

## 2011-12-11 ENCOUNTER — Ambulatory Visit: Payer: BC Managed Care – PPO | Admitting: Physical Medicine & Rehabilitation

## 2011-12-11 VITALS — BP 174/116 | HR 95 | Resp 16 | Ht 71.0 in | Wt 232.0 lb

## 2011-12-11 DIAGNOSIS — M545 Low back pain, unspecified: Secondary | ICD-10-CM | POA: Insufficient documentation

## 2011-12-11 DIAGNOSIS — G8929 Other chronic pain: Secondary | ICD-10-CM | POA: Insufficient documentation

## 2011-12-11 DIAGNOSIS — M79605 Pain in left leg: Secondary | ICD-10-CM

## 2011-12-11 DIAGNOSIS — E079 Disorder of thyroid, unspecified: Secondary | ICD-10-CM | POA: Insufficient documentation

## 2011-12-11 NOTE — Progress Notes (Signed)
Subjective:    Patient ID: Phillip Merritt, male    DOB: 02-03-1961, 51 y.o.   MRN: [id number]  HPI The patient complains about mild intermittend chronic low back pain,  which radiates into his left posterior thigh, intermittendly. The patient denies any radiation.  The problem has been stable. The patient reports that he works out regularly. Pain Inventory Average Pain 6 Pain Right Now 5 My pain is sharp and burning  In the last 24 hours, has pain interfered with the following? General activity 5 Relation with others 5 Enjoyment of life 5 What TIME of day is your pain at its worst? daytime Sleep (in general) Fair  Pain is worse with: walking, bending, sitting, standing and some activites Pain improves with: injections Relief from Meds: N/A  Mobility Do you have any goals in this area?  no  Function employed # of hrs/week 40  Neuro/Psych No problems in this area  Prior Studies Any changes since last visit?  no  Physicians involved in your care Any changes since last visit?  no   Family History  Problem Relation Age of Onset  . Diabetes Father    History   Social History  . Marital Status: Divorced    Spouse Name: N/A    Number of Children: N/A  . Years of Education: N/A   Social History Main Topics  . Smoking status: Never Smoker   . Smokeless tobacco: None  . Alcohol Use: None  . Drug Use: None  . Sexually Active: None   Other Topics Concern  . None   Social History Narrative  . None   Past Surgical History  Procedure Date  . Hand surgery   . Hernia repair   . Knee surgery    Past Medical History  Diagnosis Date  . Thyroid disease    BP 174/116  Pulse 95  Resp 16  Ht 5\' 11"  (1.803 m)  Wt 232 lb (105.235 kg)  BMI 32.36 kg/m2  SpO2 97%      Review of Systems  Constitutional: Negative.   HENT: Negative.   Eyes: Negative.   Respiratory: Negative.   Cardiovascular: Negative.   Gastrointestinal: Negative.   Genitourinary:  Negative.   Musculoskeletal: Positive for back pain.  Skin: Negative.   Neurological: Negative.   Hematological: Negative.   Psychiatric/Behavioral: Negative.        Objective:   Physical Exam  Constitutional: He is oriented to person, place, and time. He appears well-developed and well-nourished.  HENT:  Head: Normocephalic.  Eyes: Pupils are equal, round, and reactive to light.  Neck: Normal range of motion.  Musculoskeletal: Normal range of motion. He exhibits tenderness.  Neurological: He is alert and oriented to person, place, and time.  Skin: Skin is warm and dry.  Psychiatric: He has a normal mood and affect.    Symmetric normal motor tone is noted throughout. Normal muscle bulk. Muscle testing reveals 5/5 muscle strength of the upper extremity, and 5/5 of the lower extremity. Full range of motion in upper and lower extremities. ROM of spine is not restricted. Fine motor movements are normal in both hands. DTR in the upper and lower extremity are present and symmetric 2+. No clonus is noted.  Patient arises from chair without difficulty. Narrow based gait with normal arm swing bilateral , able to walk on heels and toes . Tandem walk is stable. No pronator drift. Rhomberg negative       Assessment & Plan:  Chronic LBP, radiating  into left posterior thigh, to the knee. Patient should continue with his exercising, I advised him to also strengthen his core muscles. The patient is not taking any pain medication. He should followup in 6 months or earlier if necessary.

## 2011-12-11 NOTE — Patient Instructions (Signed)
Continue with exercising, continue with medication 

## 2011-12-20 ENCOUNTER — Telehealth: Payer: Self-pay | Admitting: Physical Medicine & Rehabilitation

## 2011-12-20 NOTE — Telephone Encounter (Signed)
Would like to go back on muscle relaxer and Vicodin.  Also would like to discuss RF.

## 2011-12-20 NOTE — Telephone Encounter (Signed)
Please advise. You saw the patient back on 12/11/11.

## 2011-12-21 MED ORDER — MELOXICAM 7.5 MG PO TABS: 7.5000 mg | ORAL_TABLET | Freq: Every day | ORAL | Status: DC

## 2011-12-21 MED ORDER — METHOCARBAMOL 500 MG PO TABS: 500.0000 mg | ORAL_TABLET | Freq: Three times a day (TID) | ORAL | Status: AC

## 2011-12-21 NOTE — Telephone Encounter (Signed)
Patient can get robaxin 500mg  2-3 times per day, and Mobic 7.5 mg 1 tablet per day.if this regimen is not working might consider narcotics, if consistent UDS

## 2011-12-21 NOTE — Telephone Encounter (Signed)
Pt aware that meds have been sent in but vicodin was not. After UDS is back we will consider prescribing.

## 2011-12-26 ENCOUNTER — Telehealth: Payer: Self-pay | Admitting: *Deleted

## 2011-12-26 NOTE — Telephone Encounter (Signed)
Refill- Hydrocodone

## 2012-06-11 ENCOUNTER — Encounter
Payer: BC Managed Care – PPO | Attending: Physical Medicine & Rehabilitation | Admitting: Physical Medicine & Rehabilitation

## 2012-06-20 ENCOUNTER — Other Ambulatory Visit: Payer: Self-pay | Admitting: Physical Medicine and Rehabilitation

## 2012-07-22 ENCOUNTER — Encounter: Payer: Self-pay | Admitting: Physical Medicine & Rehabilitation

## 2012-07-22 ENCOUNTER — Ambulatory Visit
Admission: RE | Admit: 2012-07-22 | Discharge: 2012-07-22 | Disposition: A | Discharge status: Home / Self Care | Payer: BC Managed Care – PPO | Source: Ambulatory Visit | Attending: Physical Medicine & Rehabilitation | Admitting: Physical Medicine & Rehabilitation

## 2012-07-22 ENCOUNTER — Encounter
Payer: BC Managed Care – PPO | Attending: Physical Medicine & Rehabilitation | Admitting: Physical Medicine & Rehabilitation

## 2012-07-22 VITALS — BP 139/99 | HR 110 | Resp 16 | Ht 71.0 in | Wt 237.8 lb

## 2012-07-22 DIAGNOSIS — M47817 Spondylosis without myelopathy or radiculopathy, lumbosacral region: Secondary | ICD-10-CM | POA: Insufficient documentation

## 2012-07-22 DIAGNOSIS — M47816 Spondylosis without myelopathy or radiculopathy, lumbar region: Secondary | ICD-10-CM

## 2012-07-22 DIAGNOSIS — I10 Essential (primary) hypertension: Secondary | ICD-10-CM

## 2012-07-22 MED ORDER — NAPROXEN 500 MG PO TABS: 500.0000 mg | ORAL_TABLET | Freq: Two times a day (BID) | ORAL | Status: DC

## 2012-07-22 MED ORDER — METHOCARBAMOL 500 MG PO TABS: 500.0000 mg | ORAL_TABLET | Freq: Four times a day (QID) | ORAL | Status: DC | PRN

## 2012-07-22 NOTE — Progress Notes (Signed)
Subjective:    Patient ID: [name], male    DOB: 1961-06-01, 52 y.o.   MRN: [id number]  HPI  Phillip Merritt is here in follow up of his low back pain. At christmas time he bent over to pick up a box and had increased onset of low back on the left side. Nothing has improved the pain. He has tried ice, heat, stretching, but none of those worked. He has been out of mobic but tried some advil 400mg  q6 but it's not doing much either. Pain usually only radiates to the left buttock. There are no symptoms in the left leg/foot. Sitting seems to make the pain the worst. He can only sit for about 15 minutes before the symptoms kick in. It feels a little better when he lays down.   He has been out of work since April. His position was moved to [place], and he chose not to move there. He states that he hasn't been exercising much. 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   Pain Inventory Average Pain 8 Pain Right Now 7 My pain is sharp, stabbing and aching  In the last 24 hours, has pain interfered with the following? General activity 7 Relation with others 6 Enjoyment of life 9 What TIME of day is your pain at its worst? all the time Sleep (in general) Fair  Pain is worse with: walking, bending, standing and some activites Pain improves with: medication and injections Relief from Meds: 6  Mobility walk without assistance how many minutes can you walk? 30 ability to climb steps?  yes do you drive?  yes  Function Do you have any goals in this area?  no  Neuro/Psych No problems in this area  Prior Studies Any changes since last visit?  no  Physicians involved in your care Any changes since last visit?  no   Family History  Problem Relation Age of Onset  . Diabetes Father    History   Social History  . Marital Status: Divorced    Spouse Name: N/A    Number of Children: N/A  . Years of Education: N/A   Social History Main Topics  . Smoking status: Never Smoker   .  Smokeless tobacco: None  . Alcohol Use: None  . Drug Use: None  . Sexually Active: None   Other Topics Concern  . None   Social History Narrative  . None   Past Surgical History  Procedure Date  . Hand surgery   . Hernia repair   . Knee surgery    Past Medical History  Diagnosis Date  . Thyroid disease   . Hypertension    BP 139/99  Pulse 110  Resp 16  Ht 5\' 11"  (1.803 m)  Wt 237 lb 12.8 oz (107.865 kg)  BMI 33.17 kg/m2  SpO2 99%    Review of Systems  Musculoskeletal: Positive for back pain.  All other systems reviewed and are negative.       Objective:   Physical Exam Physical Exam  Constitutional: He is oriented to person, place, and time. He appears well-developed and well-nourished.  HENT:  Head: Normocephalic.  Eyes: Pupils are equal, round, and reactive to light.  Neck: Normal range of motion.  Musculoskeletal: Normal range of motion. He exhibits tenderness.  Neurological: He is alert and oriented to person, place, and time.  Skin: Skin is warm and dry.  Psychiatric: He has a normal mood and affect.  Symmetric normal motor tone is noted  throughout. Normal muscle bulk. Muscle testing reveals 5/5 muscle strength of the upper extremity, and 5/5 of the lower extremity. Full range of motion in upper and lower extremities. Pain with left lumbar bending and rotation. Mild pain with forward flexion and extension. Facet manuevers are equivocal. He had elevation of the right hemipelvis by about 0.5 inches.  Fine motor movements are normal in both hands.  DTR in the upper and lower extremity are present and symmetric 2+. No clonus is noted.  Patient arises from chair without difficulty. Narrow based gait with normal arm swing bilateral , able to walk on heels and toes . Mild antalgia on left with gait.[name] No pronator drift. Rhomberg negative   Assessment & Plan:    1. Chronic LBP. We have treated this has facet driven pain in the past. His exam today doesn't  definitively indicate this and almost appears more discogenic---although i suspect myofascial and postural causes which have arisen out of his decreased activity 2. HTN    Plan: 1. Xrays of lumbar spine to assess joint spaces, facets. 2. Begin Muscle relaxant, robaxin 500mg  q6 prn 3. Trial of Naproxen 500mg  bid. Stop advil 4. Resume regular stretching, posture and exercise program. i think his lack of activity is behind the problem 5. Follow up with me in 6 weeks.   Recommended Increase of prinivil to 10mg  also especially given the naproxen trial

## 2012-07-22 NOTE — Patient Instructions (Signed)
YOU NEED TO DEDICATE YOURSELF TO REGULAR STRETCHING, GOOD POSTURE, AND EXERCISE!

## 2012-07-23 ENCOUNTER — Telehealth: Payer: Self-pay | Admitting: Physical Medicine & Rehabilitation

## 2012-07-23 NOTE — Telephone Encounter (Signed)
Please inform patient that scan is unremarkable. He may resume his home program of exercise. If he would like, we can refer to PT for further guidance

## 2012-07-24 NOTE — Telephone Encounter (Signed)
Left message for patient to call office regarding his imaging results.

## 2012-07-25 NOTE — Telephone Encounter (Signed)
Left message for patient to call office regarding imaging results.

## 2012-08-17 ENCOUNTER — Other Ambulatory Visit: Payer: Self-pay

## 2012-09-04 ENCOUNTER — Encounter
Payer: BC Managed Care – PPO | Attending: Physical Medicine & Rehabilitation | Admitting: Physical Medicine & Rehabilitation

## 2012-09-04 ENCOUNTER — Encounter: Payer: Self-pay | Admitting: Physical Medicine & Rehabilitation

## 2012-09-04 VITALS — BP 164/115 | HR 92 | Resp 14 | Ht 71.0 in | Wt 245.0 lb

## 2012-09-04 DIAGNOSIS — M47816 Spondylosis without myelopathy or radiculopathy, lumbar region: Secondary | ICD-10-CM | POA: Insufficient documentation

## 2012-09-04 DIAGNOSIS — M47817 Spondylosis without myelopathy or radiculopathy, lumbosacral region: Secondary | ICD-10-CM | POA: Insufficient documentation

## 2012-09-04 MED ORDER — TRAMADOL HCL 50 MG PO TABS: 50.0000 mg | ORAL_TABLET | Freq: Three times a day (TID) | ORAL | Status: DC | PRN

## 2012-09-04 NOTE — Progress Notes (Signed)
Subjective:    Patient ID: Phillip Merritt, male    DOB: August 22, 1960, 52 y.o.   MRN: [id number]  HPI  Phillip Merritt is back regarding his low back pain. He has resumed his home stretching program and began medication as I prescribed at last visit, but he's not noticed much change.   His pain remains in the left low back. It bothers him most when he lifts weights when sitting. He also notes more pain with walking. He can walk about a half a mile before the back tightens up and becomes painful causing him to stop. The pain will relieve once he lays down.   We reviewed his lumbar imaging which showed normal disc spaces, good alignment, and only minimal osteophytes along posterior margins of discs.  Pain Inventory Average Pain 7 Pain Right Now 5 My pain is sharp, burning and stabbing  In the last 24 hours, has pain interfered with the following? General activity 6 Relation with others 4 Enjoyment of life 6 What TIME of day is your pain at its worst? daytime Sleep (in general) Fair  Pain is worse with: walking, bending and standing Pain improves with: pacing activities and medication Relief from Meds: 4  Mobility walk without assistance Do you have any goals in this area?  no  Function not employed: date last employed 03/13 Do you have any goals in this area?  no  Neuro/Psych No problems in this area  Prior Studies x-rays  Physicians involved in your care Any changes since last visit?  no   Family History  Problem Relation Age of Onset  . Diabetes Father    History   Social History  . Marital Status: Divorced    Spouse Name: N/A    Number of Children: N/A  . Years of Education: N/A   Social History Main Topics  . Smoking status: Never Smoker   . Smokeless tobacco: None  . Alcohol Use: None  . Drug Use: None  . Sexually Active: None   Other Topics Concern  . None   Social History Narrative  . None   Past Surgical History  Procedure Laterality Date  .  Hand surgery    . Hernia repair    . Knee surgery     Past Medical History  Diagnosis Date  . Thyroid disease   . Hypertension    BP 164/115  Pulse 92  Resp 14  Ht 5\' 11"  (1.803 m)  Wt 245 lb (111.131 kg)  BMI 34.19 kg/m2  SpO2 99%     Review of Systems  Musculoskeletal: Positive for back pain.  All other systems reviewed and are negative.       Objective:   Physical Exam Constitutional: He is oriented to person, place, and time. He appears well-developed and well-nourished.  HENT:  Head: Normocephalic.  Eyes: Pupils are equal, round, and reactive to light.  Neck: Normal range of motion.  Musculoskeletal: Normal range of motion. He exhibits tenderness.  Neurological: He is alert and oriented to person, place, and time.  Skin: Skin is warm and dry.  Psychiatric: He has a normal mood and affect.  Symmetric normal motor tone is noted throughout. Normal muscle bulk. Muscle testing reveals 5/5 muscle strength of the upper extremity, and 5/5 of the lower extremity. Full range of motion in upper and lower extremities. Less Pain with left lumbar bending and rotation. Mild pain with forward flexion and more pain with extension. Facet manuevers are positive to the left. He  had elevation of the right hemipelvis by about 0.5 inches. Fine motor movements are normal in both hands.  DTR in the upper and lower extremity are present and symmetric 2+. No clonus is noted.  Patient arises from chair without difficulty. Narrow based gait with normal arm swing bilateral , able to walk on heels and toes . Mild antalgia on left with gait.[name] No pronator drift. Rhomberg negative   Assessment & Plan:   1. Chronic LBP.  [name] His exam today is consistent with lumbar facet pathology once again. Pain is at the left L4-5, L5-S1 levels predominanty. 2. HTN   Plan:  1. Will arrange left sided MBB at L4-5 and L5-S1 per Dr. Claudette Laws.  2. continue robaxin 500mg  q6 prn  3. Continue Naproxen 500mg  bid.    4. Discussed the importance of regular stretching, posture and exercise program going forward after injections.  5. Follow up with me in 6 weeks after blocks. Consider RF's potentially as well. 30 minutes of face to face patient care time were spent during this visit. All questions were encouraged and answered.

## 2012-09-04 NOTE — Patient Instructions (Signed)
CONTINUE TO WORK ON REGULAR STRETCHING AND POSTURAL EXERCISES AT HOME

## 2012-09-26 ENCOUNTER — Ambulatory Visit (HOSPITAL_BASED_OUTPATIENT_CLINIC_OR_DEPARTMENT_OTHER): Payer: BC Managed Care – PPO | Admitting: Physical Medicine & Rehabilitation

## 2012-09-26 ENCOUNTER — Encounter: Payer: Self-pay | Admitting: Physical Medicine & Rehabilitation

## 2012-09-26 VITALS — BP 154/107 | HR 93 | Resp 16 | Ht 71.0 in | Wt 241.4 lb

## 2012-09-26 DIAGNOSIS — M47817 Spondylosis without myelopathy or radiculopathy, lumbosacral region: Secondary | ICD-10-CM

## 2012-09-26 DIAGNOSIS — M47816 Spondylosis without myelopathy or radiculopathy, lumbar region: Secondary | ICD-10-CM

## 2012-09-26 NOTE — Progress Notes (Signed)
  PROCEDURE RECORD [facility]   Name: LENWOOD BALSAM DOB:1960/07/26 MRN: [id number]  Date:09/26/2012  Physician: Claudette Laws, MD    Nurse/CMA: Leonor Liv EMT  Allergies: No Known Allergies  Consent Signed: yes  Is patient diabetic? no  CBG today?   Pregnant: no LMP: No LMP for male patient. (age 52-55)  Anticoagulants: no Anti-inflammatory: no Antibiotics: no  Procedure: left L4-5 L5-S1 medial branch block Position: Prone Start Time:12:44  End Time:12:50  Fluoro Time:20   RN/CMA Leonor Liv EMT [place] Arabie EMT    Time 12:29 12:54    BP 154/107 182 105    Pulse 93 105    Respirations 14 16    O2 Sat 98 98    S/S 6 6    Pain Level 5 0     D/C home with Toniann Fail, patient A & O X 3, D/C instructions reviewed, and sits independently.    Mr Rafuse did not take his BP medication prior to procedure because he didn't think he should take anything. I instructed him that he may take his regularly scheduled medications--just no blood thinners or antibiotics. Notify us if he is on an antibiotic, otherwise take meds.  He was instructed to take his BP medication as soon as he gets home.

## 2012-09-26 NOTE — Patient Instructions (Signed)
Please keep track of your left-sided low back pain. If this helps at least 50% of your pain and it wears off, we may need to repeat the radiofrequency procedure

## 2012-09-26 NOTE — Progress Notes (Signed)
Left Lumbar L3, L4  medial branch blocks and L 5 dorsal ramus injection under fluoroscopic guidance   Indication: Left Lumbar pain which is not relieved by medication management or other conservative care and interfering with self-care and mobility.  Informed consent was obtained after describing risks and benefits of the procedure with the patient, this includes bleeding, bruising, infection, paralysis and medication side effects.  The patient wishes to proceed and has given written consent.  The patient was placed in a prone position.  The lumbar area was marked and prepped with Betadine.  One mL of 1% lidocaine was injected into each of 3 areas into the skin and subcutaneous tissue.  Then a 22-gauge 3.5 spinal needle was inserted targeting the junction of the left S1 superior articular process and sacral ala junction.  Needle was advanced under fluoroscopic guidance.  Bone contact was made.  Omnipaque 180 was injected x 0.5 mL demonstrating no intravascular uptake.  Then a solution containing one mL of 4 mg per mL dexamethasone and 3 mL of 2% MPF lidocaine was injected x 0.5 mL.  Then the left L5 superior articular process in transverse process junction was targeted.  Bone contact was made.  Omnipaque 180 was injected x 0.5 mL demonstrating no intravascular uptake.  Then a solution containing one mL of 4 mg per mL dexamethasone and 3 mL of 2% MPF lidocaine was injected x 0.5 mL.  Then the left L4 superior articular process in transverse process junction was targeted.  Bone contact was made.  Omnipaque 180 was injected x 0.5 mL demonstrating no intravascular uptake.  Then a solution containing one mL of 4 mg per mL dexamethasone and 3 mL of 2% MPF lidocaine was injected x 0.5 mL.  Patient tolerated procedure well.  Post procedure instructions were given. 

## 2012-09-27 ENCOUNTER — Emergency Department (HOSPITAL_BASED_OUTPATIENT_CLINIC_OR_DEPARTMENT_OTHER)
Admission: EM | Admit: 2012-09-27 | Discharge: 2012-09-27 | Disposition: A | Discharge status: Home / Self Care | Payer: BC Managed Care – PPO | Attending: Emergency Medicine | Admitting: Emergency Medicine

## 2012-09-27 ENCOUNTER — Encounter (HOSPITAL_BASED_OUTPATIENT_CLINIC_OR_DEPARTMENT_OTHER): Payer: Self-pay | Admitting: *Deleted

## 2012-09-27 ENCOUNTER — Emergency Department (HOSPITAL_BASED_OUTPATIENT_CLINIC_OR_DEPARTMENT_OTHER): Payer: BC Managed Care – PPO

## 2012-09-27 DIAGNOSIS — E079 Disorder of thyroid, unspecified: Secondary | ICD-10-CM | POA: Insufficient documentation

## 2012-09-27 DIAGNOSIS — Z79899 Other long term (current) drug therapy: Secondary | ICD-10-CM | POA: Insufficient documentation

## 2012-09-27 DIAGNOSIS — X500XXA Overexertion from strenuous movement or load, initial encounter: Secondary | ICD-10-CM | POA: Insufficient documentation

## 2012-09-27 DIAGNOSIS — Y9239 Other specified sports and athletic area as the place of occurrence of the external cause: Secondary | ICD-10-CM | POA: Insufficient documentation

## 2012-09-27 DIAGNOSIS — Y9367 Activity, basketball: Secondary | ICD-10-CM | POA: Insufficient documentation

## 2012-09-27 DIAGNOSIS — S838X9A Sprain of other specified parts of unspecified knee, initial encounter: Secondary | ICD-10-CM | POA: Insufficient documentation

## 2012-09-27 DIAGNOSIS — I10 Essential (primary) hypertension: Secondary | ICD-10-CM | POA: Insufficient documentation

## 2012-09-27 DIAGNOSIS — S86812A Strain of other muscle(s) and tendon(s) at lower leg level, left leg, initial encounter: Secondary | ICD-10-CM

## 2012-09-27 DIAGNOSIS — Z9889 Other specified postprocedural states: Secondary | ICD-10-CM | POA: Insufficient documentation

## 2012-09-27 DIAGNOSIS — Z791 Long term (current) use of non-steroidal anti-inflammatories (NSAID): Secondary | ICD-10-CM | POA: Insufficient documentation

## 2012-09-27 DIAGNOSIS — Y92838 Other recreation area as the place of occurrence of the external cause: Secondary | ICD-10-CM | POA: Insufficient documentation

## 2012-09-27 MED ORDER — IBUPROFEN 800 MG PO TABS
ORAL_TABLET | ORAL | Status: AC
  Administered 2012-09-27: 800 mg via ORAL
  Filled 2012-09-27: qty 1

## 2012-09-27 MED ORDER — HYDROCODONE-ACETAMINOPHEN 5-325 MG PO TABS: 1.0000 | ORAL_TABLET | Freq: Four times a day (QID) | ORAL | Status: DC | PRN

## 2012-09-27 MED ORDER — IBUPROFEN 800 MG PO TABS: 800.0000 mg | ORAL_TABLET | Freq: Once | ORAL | Status: AC

## 2012-09-27 NOTE — ED Notes (Signed)
Dr.Knapp at bedside  

## 2012-09-27 NOTE — ED Notes (Signed)
Left knee injury. While playing basketball tonight he felt like his knee dislocated and he popped it back in but it is painful to apply weight to his leg.

## 2012-09-27 NOTE — ED Provider Notes (Signed)
History     CSN: [id number]  Arrival date & time 09/27/12  2043   First MD Initiated Contact with Patient 09/27/12 2245      Chief Complaint  Patient presents with  . Knee Injury     HPI Patient presents to the emergency room with a left knee injury. He was playing basketball this evening when he jumped and landed. He felt his knee gave way. Patient has difficulty bearing weight at this time. He feels like his knee is swollen as well. Denies any numbness or weakness or any other injuries. The pain is moderate. It increases with any movement Past Medical History  Diagnosis Date  . Thyroid disease   . Hypertension     Past Surgical History  Procedure Laterality Date  . Hand surgery    . Hernia repair    . Knee surgery      Family History  Problem Relation Age of Onset  . Diabetes Father     History  Substance Use Topics  . Smoking status: Never Smoker   . Smokeless tobacco: Not on file  . Alcohol Use: No      Review of Systems  All other systems reviewed and are negative.    Allergies  Review of patient's allergies indicates no known allergies.  Home Medications   Current Outpatient Rx  Name  Route  Sig  Dispense  Refill  . HYDROcodone-acetaminophen (NORCO) 5-325 MG per tablet   Oral   Take 1-2 tablets by mouth every 6 (six) hours as needed for pain.   30 tablet   0   . levothyroxine (SYNTHROID, LEVOTHROID) 100 MCG tablet   Oral   Take 100 mcg by mouth daily.         [name] lisinopril (PRINIVIL,ZESTRIL) 5 MG tablet   Oral   Take 5 mg by mouth daily.         . methocarbamol (ROBAXIN) 500 MG tablet   Oral   Take 1 tablet (500 mg total) by mouth every 6 (six) hours as needed.   45 tablet   2   . naproxen (NAPROSYN) 500 MG tablet   Oral   Take 1 tablet (500 mg total) by mouth 2 (two) times daily with a meal.   60 tablet   3   . traMADol (ULTRAM) 50 MG tablet   Oral   Take 1 tablet (50 mg total) by mouth every 8 (eight) hours as needed for  pain.   30 tablet   1     BP 171/115  Pulse 95  Temp(Src) 98.1 F (36.7 C) (Oral)  Resp 18  Wt 241 lb (109.317 kg)  BMI 33.63 kg/m2  SpO2 98%  Physical Exam  Nursing note and vitals reviewed. Constitutional: He appears well-developed and well-nourished. No distress.  HENT:  Head: Normocephalic and atraumatic.  Right Ear: External ear normal.  Left Ear: External ear normal.  Eyes: Conjunctivae are normal. Right eye exhibits no discharge. Left eye exhibits no discharge. No scleral icterus.  Neck: Neck supple. No tracheal deviation present.  Cardiovascular: Normal rate.   Pulmonary/Chest: Effort normal. No stridor. No respiratory distress.  Musculoskeletal: He exhibits no edema.       Left knee: He exhibits decreased range of motion, swelling and effusion. Tenderness found. Patellar tendon tenderness noted.  The patient is unable to extend his lower leg against gravity,  Neurological: He is alert. Cranial nerve deficit: no gross deficits.  Skin: Skin is warm and dry. No rash  noted.  Psychiatric: He has a normal mood and affect.    ED Course  Procedures (including critical care time)  Labs Reviewed - No data to display Dg Knee Complete 4 Views Left  09/27/2012  *RADIOLOGY REPORT*  Clinical Data: Knee injury.  Basketball injury.  LEFT KNEE - COMPLETE 4+ VIEW  Comparison: None.  Findings: There are bone densities inferior to the patella within the anterior soft tissues.  These appear to be well corticated. These are likely related to patellar tendon injury or avulsion off the inferior pole of the patella.  This is age indeterminate as these appear rounded well corticated.  There does appear to be anterior soft tissue swelling.  Findings suspicious for acute injury.  No visible effusion.  No femoral or proximal tibia / fibula abnormality.  IMPRESSION: Bone densities inferior to the patella anteriorly with associated soft tissue swelling.  Findings concerning for avulsion off the  inferior aspect of the patella.   Original Report Authenticated By: Charlett Nose, M.D.      1. Patellar tendon rupture, left, initial encounter       MDM  Patient's exam is consistent with a patellar tendon rupture. He is unable to keep his leg extended and straight. He was placed in a knee immobilizer and crutches. He'll be referred to orthopedic surgery for further treatment        Celene Kras, MD 09/27/12 2305

## 2012-10-03 ENCOUNTER — Other Ambulatory Visit: Payer: Self-pay | Admitting: Orthopedic Surgery

## 2012-10-03 DIAGNOSIS — S86812A Strain of other muscle(s) and tendon(s) at lower leg level, left leg, initial encounter: Secondary | ICD-10-CM

## 2012-10-04 ENCOUNTER — Ambulatory Visit
Admission: RE | Admit: 2012-10-04 | Discharge: 2012-10-04 | Disposition: A | Discharge status: Home / Self Care | Payer: BC Managed Care – PPO | Source: Ambulatory Visit | Attending: Orthopedic Surgery | Admitting: Orthopedic Surgery

## 2012-10-04 DIAGNOSIS — S86812A Strain of other muscle(s) and tendon(s) at lower leg level, left leg, initial encounter: Secondary | ICD-10-CM

## 2012-10-29 ENCOUNTER — Encounter
Payer: BC Managed Care – PPO | Attending: Physical Medicine & Rehabilitation | Admitting: Physical Medicine & Rehabilitation

## 2012-10-29 DIAGNOSIS — M47817 Spondylosis without myelopathy or radiculopathy, lumbosacral region: Secondary | ICD-10-CM | POA: Insufficient documentation

## 2013-01-14 ENCOUNTER — Telehealth: Payer: Self-pay

## 2013-01-14 NOTE — Telephone Encounter (Signed)
Patient called requesting tramadol refill.  Patient has not been seen since march.  He will be seen by Clydie Braun to discuss refill.

## 2013-01-15 ENCOUNTER — Encounter
Payer: BC Managed Care – PPO | Attending: Physical Medicine and Rehabilitation | Admitting: Physical Medicine and Rehabilitation

## 2013-01-22 ENCOUNTER — Encounter
Payer: BC Managed Care – PPO | Attending: Physical Medicine and Rehabilitation | Admitting: Physical Medicine and Rehabilitation

## 2013-05-08 ENCOUNTER — Other Ambulatory Visit: Payer: Self-pay

## 2015-02-17 ENCOUNTER — Encounter
Payer: BLUE CROSS/BLUE SHIELD | Attending: Physical Medicine & Rehabilitation | Admitting: Physical Medicine & Rehabilitation

## 2015-02-17 ENCOUNTER — Ambulatory Visit
Admission: RE | Admit: 2015-02-17 | Discharge: 2015-02-17 | Disposition: A | Discharge status: Home / Self Care | Payer: BLUE CROSS/BLUE SHIELD | Source: Ambulatory Visit | Attending: Physical Medicine & Rehabilitation | Admitting: Physical Medicine & Rehabilitation

## 2015-02-17 ENCOUNTER — Encounter: Payer: Self-pay | Admitting: Physical Medicine & Rehabilitation

## 2015-02-17 VITALS — BP 157/100 | HR 105

## 2015-02-17 DIAGNOSIS — M545 Low back pain: Secondary | ICD-10-CM | POA: Diagnosis present

## 2015-02-17 DIAGNOSIS — M47816 Spondylosis without myelopathy or radiculopathy, lumbar region: Secondary | ICD-10-CM

## 2015-02-17 DIAGNOSIS — M25562 Pain in left knee: Secondary | ICD-10-CM | POA: Insufficient documentation

## 2015-02-17 DIAGNOSIS — S86812A Strain of other muscle(s) and tendon(s) at lower leg level, left leg, initial encounter: Secondary | ICD-10-CM | POA: Insufficient documentation

## 2015-02-17 DIAGNOSIS — G8929 Other chronic pain: Secondary | ICD-10-CM | POA: Insufficient documentation

## 2015-02-17 DIAGNOSIS — S86812S Strain of other muscle(s) and tendon(s) at lower leg level, left leg, sequela: Secondary | ICD-10-CM | POA: Diagnosis not present

## 2015-02-17 DIAGNOSIS — I10 Essential (primary) hypertension: Secondary | ICD-10-CM | POA: Insufficient documentation

## 2015-02-17 MED ORDER — MELOXICAM 15 MG PO TABS: 15.0000 mg | ORAL_TABLET | Freq: Every day | ORAL | Status: DC

## 2015-02-17 MED ORDER — TRAMADOL HCL 50 MG PO TABS: 50.0000 mg | ORAL_TABLET | Freq: Three times a day (TID) | ORAL | Status: DC | PRN

## 2015-02-17 MED ORDER — METHOCARBAMOL 500 MG PO TABS: 500.0000 mg | ORAL_TABLET | Freq: Four times a day (QID) | ORAL | Status: DC | PRN

## 2015-02-17 NOTE — Progress Notes (Signed)
Subjective:    Patient ID: Phillip Merritt, male    DOB: 1961/03/08, 54 y.o.   MRN: [id number]  HPI   This a return visit for Mr. Sennett who had last been in March 2014. He has had increased pain in the left low back over the last month or so. He also feels that it was slowly increasing prior to this. It doesn't refer anywhere. The pain is worse with standing or sitting for prolonged periods of time.   For pain relief he's using naproxen otc, ice, heat. Ice seems to provide the most relief. Lying down helps. Sleep is affected also.     Pain Inventory Average Pain 8 Pain Right Now 5 My pain is sharp  In the last 24 hours, has pain interfered with the following? General activity 4 Relation with others 4 Enjoyment of life 3 What TIME of day is your pain at its worst? daytime Sleep (in general) Poor  Pain is worse with: walking, bending and sitting Pain improves with: rest Relief from Meds: 7  Mobility how many minutes can you walk? 10 ability to climb steps?  yes do you drive?  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  yes  Neuro/Psych No problems in this area  Prior Studies Any changes since last visit?  no  Physicians involved in your care Any changes since last visit?  no   Family History  Problem Relation Age of Onset  . Diabetes Father    Social History   Social History  . Marital Status: Divorced    Spouse Name: N/A  . Number of Children: N/A  . Years of Education: N/A   Social History Main Topics  . Smoking status: Never Smoker   . Smokeless tobacco: None  . Alcohol Use: No  . Drug Use: No  . Sexual Activity: Not Asked   Other Topics Concern  . None   Social History Narrative   Past Surgical History  Procedure Laterality Date  . Hand surgery    . Hernia repair    . Knee surgery     Past Medical History  Diagnosis Date  . Thyroid disease   . Hypertension    BP 157/100  mmHg  Pulse 105  SpO2 98%  Opioid Risk Score:   Fall Risk Score:  `1  Depression screen PHQ 2/9  Depression screen PHQ 2/9 02/17/2015  Decreased Interest 3  Down, Depressed, Hopeless 0  PHQ - 2 Score 3      Review of Systems  All other systems reviewed and are negative.      Objective:   Physical Exam  Constitutional: He is oriented to person, place, and time. He appears well-developed and well-nourished.  HENT:  Head: Normocephalic.  Eyes: Pupils are equal, round, and reactive to light.  Neck: Normal range of motion.  Musculoskeletal: Normal range of motion. He exhibits tenderness.  Neurological: He is alert and oriented to person, place, and time.  Skin: Skin is warm and dry.  Psychiatric: He has a normal mood and affect.  Symmetric normal motor tone is noted throughout. Normal muscle bulk. Muscle testing reveals 5/5 muscle strength of the upper extremity, and 5/5 of the lower extremity. Full range of motion in upper and lower extremities. minimal Pain with left lumbar bending and rotation. Minimal pain with forward flexion and more pain with extension. Facet manuevers are positive to the left.  [name]  Fine motor movements are normal in both hands.  DTR in the upper and lower extremity are present and symmetric 2+. No clonus is noted.  Patient arises from chair without difficulty.   He has antalgia on left in standing and with gait. He has a large scar over left knee. There is edema around the patella .    Assessment & Plan:   1. Chronic LBP. [name] His exam today is still consistent with lumbar facet pathology once again---predominantly in the left lower lumbar spine. Pain i believe is exacerbated by his left knee. 2. HTN  3. Hx of left patellar tendon rupture---persistent left knee pain  Plan:  1. Ordered xr of lumbar spine to reassess given gradual and then acute increase in pain.  2. refilled robaxin  q6 prn  3. Add mobic  qd with food.  4. Add  tramadol  q6 prn #45. 5. Consider further MBB's 6. Would benefit from ortho eval of left knee also 7. I will see him back in about a month. 30 minutes of face to face patient care time were spent during this visit. All questions were encouraged and answered.

## 2015-02-17 NOTE — Patient Instructions (Signed)
PLEASE CALL ME WITH ANY PROBLEMS OR QUESTIONS (#[phone]).  HAVE A GOOD DAY!    

## 2015-02-19 ENCOUNTER — Telehealth: Payer: Self-pay | Admitting: Physical Medicine & Rehabilitation

## 2015-02-19 NOTE — Telephone Encounter (Signed)
Please let MR. Phillip Merritt know that his lumbar spine xr is essentially normal. Could consider PT to address rom, posture, core lengthening/stretching, HEP. Otherwise, Continue with current plan and we'll discuss further at next visit.

## 2015-02-23 NOTE — Telephone Encounter (Signed)
Left VM to return call to office so that we may give Dr Rosalyn Charters message

## 2015-02-25 NOTE — Telephone Encounter (Signed)
Attempted 2nd call, went straight to voicemail

## 2015-03-02 NOTE — Telephone Encounter (Signed)
Final attempt to reach Phillip Merritt unsuccessful.  Encounter will be closed and can be discussed at appt 03/17/15

## 2015-03-17 ENCOUNTER — Encounter: Payer: BLUE CROSS/BLUE SHIELD | Admitting: Registered Nurse

## 2015-03-26 ENCOUNTER — Encounter: Payer: Self-pay | Admitting: Registered Nurse

## 2015-03-26 ENCOUNTER — Encounter: Payer: BLUE CROSS/BLUE SHIELD | Attending: Physical Medicine & Rehabilitation | Admitting: Registered Nurse

## 2015-03-26 VITALS — BP 143/97 | HR 105

## 2015-03-26 DIAGNOSIS — M25562 Pain in left knee: Secondary | ICD-10-CM | POA: Diagnosis not present

## 2015-03-26 DIAGNOSIS — G8929 Other chronic pain: Secondary | ICD-10-CM | POA: Diagnosis not present

## 2015-03-26 DIAGNOSIS — M545 Low back pain: Secondary | ICD-10-CM | POA: Diagnosis present

## 2015-03-26 DIAGNOSIS — M47816 Spondylosis without myelopathy or radiculopathy, lumbar region: Secondary | ICD-10-CM | POA: Diagnosis not present

## 2015-03-26 DIAGNOSIS — I10 Essential (primary) hypertension: Secondary | ICD-10-CM | POA: Insufficient documentation

## 2015-03-26 MED ORDER — TRAMADOL HCL 50 MG PO TABS: 50.0000 mg | ORAL_TABLET | Freq: Three times a day (TID) | ORAL | Status: DC | PRN

## 2015-03-26 NOTE — Progress Notes (Signed)
Subjective:    Patient ID: Phillip Merritt, male    DOB: Jul 05, 1960, 54 y.o.   MRN: [id number]  HPI: Phillip Merritt is a 54 year old male who returns for follow up appointment and medication refill. He says his pain is located in her lower back mainly left side. Also states his pain has intensified in his lower back and Tramadol increased. He verbalizes understanding. His current exercise regime is walking and performing stretching exercises.  Pain Inventory Average Pain 7 Pain Right Now 6 My pain is stabbing and aching  In the last 24 hours, has pain interfered with the following? General activity 7 Relation with others 6 Enjoyment of life 9 What TIME of day is your pain at its worst? daytime Sleep (in general) Poor  Pain is worse with: walking, sitting, standing and some activites Pain improves with: medication and injections Relief from Meds: a littla  Mobility ability to climb steps?  yes do you drive?  yes  Function employed # of hrs/week 40  Neuro/Psych No problems in this area  Prior Studies Any changes since last visit?  no  Physicians involved in your care Any changes since last visit?  no   Family History  Problem Relation Age of Onset  . Diabetes Father    Social History   Social History  . Marital Status: Divorced    Spouse Name: N/A  . Number of Children: N/A  . Years of Education: N/A   Social History Main Topics  . Smoking status: Never Smoker   . Smokeless tobacco: None  . Alcohol Use: No  . Drug Use: No  . Sexual Activity: Not Asked   Other Topics Concern  . None   Social History Narrative   Past Surgical History  Procedure Laterality Date  . Hand surgery    . Hernia repair    . Knee surgery     Past Medical History  Diagnosis Date  . Thyroid disease   . Hypertension    BP 143/97 mmHg  Pulse 105  SpO2 97%  Opioid Risk Score:   Fall Risk Score:  `1  Depression screen PHQ 2/9  Depression screen PHQ 2/9 02/17/2015    Decreased Interest 3  Down, Depressed, Hopeless 0  PHQ - 2 Score 3     Review of Systems  All other systems reviewed and are negative.      Objective:   Physical Exam  Constitutional: He is oriented to person, place, and time. He appears well-developed and well-nourished.  HENT:  Head: Normocephalic and atraumatic.  Neck: Normal range of motion. Neck supple.  Cardiovascular: Normal rate and regular rhythm.   Pulmonary/Chest: Effort normal and breath sounds normal.  Musculoskeletal:  Normal Muscle Bulk and Muscle Testing Reveals: Upper Extremities: Full ROM and Muscle Strength 5/5 Spinal Forward Flexion: 90 Degrees and Extension 20 Degrees Lower Extremities: Full ROM and Muscle Strength 5/5 Arises from chair with ease Narrow Based Gait  Neurological: He is alert and oriented to person, place, and time.  Skin: Skin is warm and dry.  Psychiatric: He has a normal mood and affect.  Nursing note and vitals reviewed.         Assessment & Plan:  1. Chronic LBP: RX:  Tramadol 50 mg one tablet every 8 hours as needed #55. Encourage to increase activity as tolerated. Reviewed Lumbar X-ray:  Negative Continue Robaxin and Mobic  30 minutes of face to face patient care time was spent during this visit.  All questions were encouraged and answered.

## 2015-04-14 ENCOUNTER — Ambulatory Visit: Payer: BLUE CROSS/BLUE SHIELD | Attending: Registered Nurse

## 2015-05-12 ENCOUNTER — Encounter
Payer: BLUE CROSS/BLUE SHIELD | Attending: Physical Medicine & Rehabilitation | Admitting: Physical Medicine & Rehabilitation

## 2015-05-12 DIAGNOSIS — M545 Low back pain: Secondary | ICD-10-CM | POA: Insufficient documentation

## 2015-05-12 DIAGNOSIS — I10 Essential (primary) hypertension: Secondary | ICD-10-CM | POA: Insufficient documentation

## 2015-05-12 DIAGNOSIS — M25562 Pain in left knee: Secondary | ICD-10-CM | POA: Insufficient documentation

## 2015-05-12 DIAGNOSIS — G8929 Other chronic pain: Secondary | ICD-10-CM | POA: Insufficient documentation

## 2015-05-24 ENCOUNTER — Encounter: Payer: BLUE CROSS/BLUE SHIELD | Admitting: Physical Medicine & Rehabilitation

## 2015-07-02 ENCOUNTER — Other Ambulatory Visit: Payer: Self-pay | Admitting: Physical Medicine & Rehabilitation

## 2015-07-09 ENCOUNTER — Telehealth: Payer: Self-pay

## 2015-07-09 DIAGNOSIS — M47816 Spondylosis without myelopathy or radiculopathy, lumbar region: Secondary | ICD-10-CM

## 2015-07-09 NOTE — Telephone Encounter (Signed)
Pt called requesting a refill for Tramadol. His last appt was 09/23. He has a scheduled appt at the end of this month. Please advise on refill? Thanks!

## 2015-07-13 MED ORDER — TRAMADOL HCL 50 MG PO TABS: 50.0000 mg | ORAL_TABLET | Freq: Three times a day (TID) | ORAL | Status: DC | PRN

## 2015-07-13 NOTE — Telephone Encounter (Signed)
Please fill

## 2015-07-13 NOTE — Telephone Encounter (Signed)
Tramadol has been called in. Left message to make pt aware.

## 2015-08-03 ENCOUNTER — Encounter: Payer: BLUE CROSS/BLUE SHIELD | Admitting: Physical Medicine & Rehabilitation

## 2015-08-16 ENCOUNTER — Encounter: Payer: Self-pay | Admitting: Physical Medicine & Rehabilitation

## 2015-08-16 ENCOUNTER — Encounter
Payer: BLUE CROSS/BLUE SHIELD | Attending: Physical Medicine & Rehabilitation | Admitting: Physical Medicine & Rehabilitation

## 2015-08-16 VITALS — BP 159/102 | HR 93

## 2015-08-16 DIAGNOSIS — I10 Essential (primary) hypertension: Secondary | ICD-10-CM | POA: Insufficient documentation

## 2015-08-16 DIAGNOSIS — M545 Low back pain: Secondary | ICD-10-CM | POA: Insufficient documentation

## 2015-08-16 DIAGNOSIS — G8929 Other chronic pain: Secondary | ICD-10-CM | POA: Insufficient documentation

## 2015-08-16 DIAGNOSIS — M47816 Spondylosis without myelopathy or radiculopathy, lumbar region: Secondary | ICD-10-CM

## 2015-08-16 DIAGNOSIS — M25562 Pain in left knee: Secondary | ICD-10-CM | POA: Insufficient documentation

## 2015-08-16 NOTE — Patient Instructions (Signed)
CONTINUE WITH YOUR HOME EXERCISES.  PRACTICE THE NEW EXERCISES I'VE PROVIDED YOU.

## 2015-08-16 NOTE — Progress Notes (Signed)
Subjective:    Patient ID: Phillip Merritt, male    DOB: 1961/01/11, 55 y.o.   MRN: [id number]  HPI  Phillip Merritt is here in follow up of his low back pain. I last saw him August and he followed up with our NP in September. His low back continues to give him trouble at times. It does not appear as severe as when we were last treating him. His left low back tends to be the most painful. It is worse if he has to sit for long periods of time at work or in the car. It gets better if he changes positions. He does admit to "tensing up" at times which may make the pain worse. The tramadol seems to help. He may take 0-3 per day depending upon severity. He uses robaxin sparingly but feels that this helps a great deal.  He is doing some stretching. His exercise is intermittent at best. He is not golfing any more.    Pain Inventory Average Pain 7 Pain Right Now 5 My pain is n/a  In the last 24 hours, has pain interfered with the following? General activity 6 Relation with others 6 Enjoyment of life 8 What TIME of day is your pain at its worst? daytime Sleep (in general) Fair  Pain is worse with: walking, sitting and standing Pain improves with: heat/ice, medication and injections Relief from Meds: n/a  Mobility Do you have any goals in this area?  no  Function employed # of hrs/week 40  Neuro/Psych No problems in this area  Prior Studies Any changes since last visit?  no  Physicians involved in your care Any changes since last visit?  no   Family History  Problem Relation Age of Onset  . Diabetes Father    Social History   Social History  . Marital Status: Divorced    Spouse Name: N/A  . Number of Children: N/A  . Years of Education: N/A   Social History Main Topics  . Smoking status: Never Smoker   . Smokeless tobacco: None  . Alcohol Use: No  . Drug Use: No  . Sexual Activity: Not Asked   Other Topics Concern  . None   Social History Narrative   Past Surgical  History  Procedure Laterality Date  . Hand surgery    . Hernia repair    . Knee surgery     Past Medical History  Diagnosis Date  . Thyroid disease   . Hypertension    BP 159/102 mmHg  Pulse 93  SpO2 99%  Opioid Risk Score:   Fall Risk Score:  `1  Depression screen PHQ 2/9  Depression screen [facility] 2/9 08/16/2015 02/17/2015  Decreased Interest 3 3  Down, Depressed, Hopeless 0 0  PHQ - 2 Score 3 3     Review of Systems  All other systems reviewed and are negative.      Objective:   Physical Exam  Constitutional: He is oriented to person, place, and time. He appears well-developed and well-nourished.  HENT:  Head: Normocephalic.  Eyes: Pupils are equal, round, and reactive to light.  Neck: Normal range of motion.  Musculoskeletal: Normal range of motion. He exhibits tenderness.  Neurological: He is alert and oriented to person, place, and time.  Skin: Skin is warm and dry.  Psychiatric: He has a normal mood and affect.  Symmetric normal motor tone is noted throughout. Normal muscle bulk. Muscle testing reveals 5/5 muscle strength of the upper extremity, and  5/5 of the lower extremity. Full range of motion in upper and lower extremities. minimal Pain with left lumbar bending. Facet manuevers are positive to the left. Mild to moderate pain with flexion but can touch his toes . Fine motor movements are normal in both hands.  DTR in the upper and lower extremity are present and symmetric 2+. No clonus is noted.  Patient arises from chair without difficulty. Gait is improved. He does not appear to favor the left lower ext during stance.   .  Assessment & Plan:   1. Chronic LBP.  His exam today is still consistent with lumbar facet pathology once again---predominantly in the left lower lumbar spine.   2. HTN  3. Hx of left patellar tendon rupture-----pain has resolved.   Plan:  1.  Provided pilates and facet exercises.   2.   robaxin  q6 prn  3.  Mobic  qd with  food. May continue 4.  Tramadol  q6 prn #55. (did not need RF today) 5.  Consider formal PT. Don't believe injections are indicated at this point.  6.  Left knee per ortho (improved) 7. I will see him back in about 3 months. 15 minutes of face to face patient care time were spent during this visit. All questions were encouraged and answered.

## 2015-09-17 ENCOUNTER — Other Ambulatory Visit: Payer: Self-pay | Admitting: Registered Nurse

## 2015-09-20 MED ORDER — TRAMADOL HCL 50 MG PO TABS: 50.0000 mg | ORAL_TABLET | Freq: Three times a day (TID) | ORAL | Status: DC | PRN

## 2015-11-10 ENCOUNTER — Encounter: Payer: BLUE CROSS/BLUE SHIELD | Admitting: Physical Medicine & Rehabilitation

## 2015-11-17 ENCOUNTER — Encounter: Payer: BLUE CROSS/BLUE SHIELD | Admitting: Physical Medicine & Rehabilitation

## 2015-12-21 ENCOUNTER — Other Ambulatory Visit: Payer: Self-pay | Admitting: Registered Nurse

## 2016-03-01 ENCOUNTER — Encounter: Payer: BLUE CROSS/BLUE SHIELD | Admitting: Physical Medicine & Rehabilitation

## 2016-03-29 ENCOUNTER — Encounter: Payer: BLUE CROSS/BLUE SHIELD | Admitting: Physical Medicine & Rehabilitation

## 2016-04-28 ENCOUNTER — Telehealth: Payer: Self-pay | Admitting: Physical Medicine & Rehabilitation

## 2016-05-10 ENCOUNTER — Encounter
Payer: BLUE CROSS/BLUE SHIELD | Attending: Physical Medicine & Rehabilitation | Admitting: Physical Medicine & Rehabilitation

## 2016-05-10 ENCOUNTER — Encounter: Payer: Self-pay | Admitting: Physical Medicine & Rehabilitation

## 2016-05-10 VITALS — BP 166/115 | HR 95 | Resp 16

## 2016-05-10 DIAGNOSIS — G8929 Other chronic pain: Secondary | ICD-10-CM | POA: Insufficient documentation

## 2016-05-10 DIAGNOSIS — E079 Disorder of thyroid, unspecified: Secondary | ICD-10-CM | POA: Insufficient documentation

## 2016-05-10 DIAGNOSIS — M1288 Other specific arthropathies, not elsewhere classified, other specified site: Secondary | ICD-10-CM

## 2016-05-10 DIAGNOSIS — I10 Essential (primary) hypertension: Secondary | ICD-10-CM | POA: Insufficient documentation

## 2016-05-10 DIAGNOSIS — M47816 Spondylosis without myelopathy or radiculopathy, lumbar region: Secondary | ICD-10-CM

## 2016-05-10 DIAGNOSIS — M545 Low back pain: Secondary | ICD-10-CM | POA: Diagnosis present

## 2016-05-10 MED ORDER — TRAMADOL HCL 50 MG PO TABS: 50.0000 mg | ORAL_TABLET | Freq: Three times a day (TID) | ORAL | 1 refills | Status: DC | PRN

## 2016-05-10 MED ORDER — DICLOFENAC SODIUM 75 MG PO TBEC: 75.0000 mg | DELAYED_RELEASE_TABLET | Freq: Two times a day (BID) | ORAL | 3 refills | Status: DC

## 2016-05-10 NOTE — Patient Instructions (Signed)
Aggressively stretch your back. Lots of flexion!!

## 2016-05-10 NOTE — Progress Notes (Signed)
Subjective:    Patient ID: Phillip Merritt, male    DOB: 01-17-1961, 55 y.o.   MRN: [id number]  HPI   Phillip Merritt is here in follow up of his chronic pain. The back has been worsening again on the left side. The pain can wax and wane. He's finding it's painful to sit at work as well as if he has to stand awhile. He has backed off his work out program because of the pain. Bending/stretches.   He is using tramadol for pain twice daily typically. Robaxin is used occasionally at night.    Pain Inventory Average Pain 7 Pain Right Now 5 My pain is sharp, stabbing and aching  In the last 24 hours, has pain interfered with the following? General activity 6 Relation with others 6 Enjoyment of life 8 What TIME of day is your pain at its worst? daytime Sleep (in general) Fair  Pain is worse with: walking, bending, sitting, standing and some activites Pain improves with: medication and injections Relief from Meds: 4  Mobility walk without assistance Do you have any goals in this area?  no  Function employed # of hrs/week 37.5 Do you have any goals in this area?  no  Neuro/Psych No problems in this area  Prior Studies Any changes since last visit?  no  Physicians involved in your care Any changes since last visit?  no   Family History  Problem Relation Age of Onset  . Diabetes Father    Social History   Social History  . Marital status: Divorced    Spouse name: N/A  . Number of children: N/A  . Years of education: N/A   Social History Main Topics  . Smoking status: Never Smoker  . Smokeless tobacco: Never Used  . Alcohol use No  . Drug use: No  . Sexual activity: Not Asked   Other Topics Concern  . None   Social History Narrative  . None   Past Surgical History:  Procedure Laterality Date  . HAND SURGERY    . HERNIA REPAIR    . KNEE SURGERY     Past Medical History:  Diagnosis Date  . Hypertension   . Thyroid disease    BP (!) 166/115 (BP Location:  Right Arm, Patient Position: Sitting, Cuff Size: Large)   Pulse 95   Resp 16   SpO2 94%   Opioid Risk Score:   Fall Risk Score:  `1  Depression screen PHQ 2/9  [facility] 08/16/2015 02/17/2015  Decreased Interest 3 3  Down, Depressed, Hopeless 0 0  PHQ - 2 Score 3 3    Review of Systems  Constitutional: Negative.   HENT: Negative.   Eyes: Negative.   Respiratory: Negative.   Cardiovascular: Negative.   Gastrointestinal: Negative.   Endocrine: Negative.   Genitourinary: Negative.   Musculoskeletal: Positive for back pain.  Skin: Negative.   Allergic/Immunologic: Negative.   Neurological: Negative.   Hematological: Negative.   Psychiatric/Behavioral: Negative.   All other systems reviewed and are negative.      Objective:   Physical Exam  Constitutional: He is oriented to person, place, and time. He appears well-developed and well-nourished.  HENT:  Head: Normocephalic.  Eyes: Pupils are equal, round, and reactive to light.  Neck: Normal range of motion.  Musculoskeletal: head forward posture.  pain with extension and facet maneuvers more on the left than right.    Full range of motion in upper and lower extremities. minimal Pain  with left lumbar bending. Facet manuevers are positive to the left. Mild to moderate pain with flexion but can touch his toes . Fine motor movements are normal in both hands.  DTR in the upper and lower extremity are present and symmetric 2+. No clonus is noted.  Normal gait. strength 5/5. Normal sensation .  Assessment & Plan:   1. Chronic LBP.  Which remains consistent with  lumbar facet arthropathy--- left lower lumbar spine.   2. HTN  3. Hx of left patellar tendon rupture-----pain has resolved.   Plan:  1.  Continue with pilates and facet exercises.   2.   robaxin 500mg --may continue 3.  Will try diclofenac in place of mobic 4.  Tramadol 50mg  q6 prn #55.--needs refill today 5.  [place].    6.  Consider follow up MBB's based on response to the above 7. I will see him back in about 2 months. 15 minutes of face to face patient care time were spent during this visit. All questions were encouraged and answered.

## 2016-06-07 ENCOUNTER — Telehealth: Payer: Self-pay

## 2016-06-07 NOTE — Telephone Encounter (Signed)
Patient would like clarification on his FMLA papers to know if it is 3-5 days per month and not 3-5 days per week if something was to happen. Please advise.

## 2016-06-22 NOTE — Telephone Encounter (Signed)
[place] DOCS FOR [place] FOR [place] HAVE NOT SEEN PTN SINCE 02.17 - MESSAGE TO ZS TO SEE IF HE WILL COMPLETE DUE TO PERIOD OF ABSENCE FROM [employer]

## 2016-07-12 ENCOUNTER — Encounter
Payer: BLUE CROSS/BLUE SHIELD | Attending: Physical Medicine & Rehabilitation | Admitting: Physical Medicine & Rehabilitation

## 2016-07-12 ENCOUNTER — Encounter: Payer: Self-pay | Admitting: Physical Medicine & Rehabilitation

## 2016-07-12 VITALS — BP 171/138 | HR 114 | Resp 14

## 2016-07-12 DIAGNOSIS — E079 Disorder of thyroid, unspecified: Secondary | ICD-10-CM | POA: Diagnosis not present

## 2016-07-12 DIAGNOSIS — M1288 Other specific arthropathies, not elsewhere classified, other specified site: Secondary | ICD-10-CM

## 2016-07-12 DIAGNOSIS — I10 Essential (primary) hypertension: Secondary | ICD-10-CM | POA: Diagnosis not present

## 2016-07-12 DIAGNOSIS — G8929 Other chronic pain: Secondary | ICD-10-CM | POA: Insufficient documentation

## 2016-07-12 DIAGNOSIS — M47816 Spondylosis without myelopathy or radiculopathy, lumbar region: Secondary | ICD-10-CM

## 2016-07-12 DIAGNOSIS — M545 Low back pain: Secondary | ICD-10-CM | POA: Diagnosis present

## 2016-07-12 NOTE — Patient Instructions (Signed)
CONTINUE WITH REGULAR EXERCISE AND STRETCHING.   BEGIN THERAPY WHEN YOU CAN.

## 2016-07-12 NOTE — Progress Notes (Signed)
Subjective:    Patient ID: Phillip Merritt, male    DOB: 1961/06/28, 56 y.o.   MRN: [id number]  HPI   Phillip Merritt is here in follow up of his chronic low back pain. He states things are "about the same". The voltaren may have helped a little bit. His job will not let him leave for work until his FLMA paperwork is completed. I had completed paperwork last month however.   His PCP is seeing him for elevated BP. His medication was changed today.    Pain Inventory Average Pain 7 Pain Right Now 5 My pain is sharp, stabbing and aching  In the last 24 hours, has pain interfered with the following? General activity 7 Relation with others 6 Enjoyment of life 7 What TIME of day is your pain at its worst? daytime Sleep (in general) Fair  Pain is worse with: walking, bending, sitting and standing Pain improves with: injections Relief from Meds: 3  Mobility Do you have any goals in this area?  no  Function employed # of hrs/week 40 Do you have any goals in this area?  no  Neuro/Psych No problems in this area  Prior Studies Any changes since last visit?  no  Physicians involved in your care Any changes since last visit?  no   Family History  Problem Relation Age of Onset  . Diabetes Father    Social History   Social History  . Marital status: Divorced    Spouse name: N/A  . Number of children: N/A  . Years of education: N/A   Social History Main Topics  . Smoking status: Never Smoker  . Smokeless tobacco: Never Used  . Alcohol use No  . Drug use: No  . Sexual activity: Not Asked   Other Topics Concern  . None   Social History Narrative  . None   Past Surgical History:  Procedure Laterality Date  . HAND SURGERY    . HERNIA REPAIR    . KNEE SURGERY     Past Medical History:  Diagnosis Date  . Hypertension   . Thyroid disease    BP (!) 171/138   Pulse (!) 114   Resp 14   SpO2 98%   Opioid Risk Score:   Fall Risk Score:  `1  Depression screen PHQ  2/9  [facility] 08/16/2015 02/17/2015  Decreased Interest 3 3  Down, Depressed, Hopeless 0 0  PHQ - 2 Score 3 3    Review of Systems  Constitutional: Negative.   HENT: Negative.   Eyes: Negative.   Respiratory: Negative.   Cardiovascular: Negative.   Gastrointestinal: Negative.   Endocrine: Negative.   Genitourinary: Negative.   Musculoskeletal: Negative.   Skin: Negative.   Allergic/Immunologic: Negative.   Neurological: Negative.   Hematological: Negative.   Psychiatric/Behavioral: Negative.   All other systems reviewed and are negative.      Objective:   Physical Exam Constitutional: He is oriented to person, place, and time. He appears well-developed and well-nourished.  HENT:  Head: Normocephalic.  Eyes: Pupils are equal, round, and reactive to light.  Neck: Normal range of motion.  Heart: RRR Musculoskeletal: continued low back pain. Worse with extension and facet maneuvers. .  Normal gait. strength 5/5. Normal sensation  .  Assessment & Plan:  1. Chronic LBP. Which remains consistent with  lumbar facet arthropathy--- left lower lumbar spine.  2. HTN  3. Hx of left patellar tendon rupture-----pain has resolved.  Plan:  1. Continue with pilates and facet exercises and HEP.  2. Robaxin 500mg --prn spasms 3. Continue diclofenac--needs to consider holding if BP doesn't improve with med changes. 4. Tramadol 50mg  q6 prn #55.--needs refill today 5.  Begin outpt PT in [place] once FLMA is completed.  6. Consider follow up MBB's based on response to therapy 7. I will see him back in about 2 months. 15 minutes of face to face patient care time were spent during this visit. All questions were encouraged and answered.

## 2016-09-06 ENCOUNTER — Encounter
Payer: BLUE CROSS/BLUE SHIELD | Attending: Physical Medicine & Rehabilitation | Admitting: Physical Medicine & Rehabilitation

## 2016-09-06 ENCOUNTER — Encounter: Payer: Self-pay | Admitting: Physical Medicine & Rehabilitation

## 2016-09-06 VITALS — BP 156/117 | HR 109 | Resp 14

## 2016-09-06 DIAGNOSIS — G8929 Other chronic pain: Secondary | ICD-10-CM | POA: Diagnosis not present

## 2016-09-06 DIAGNOSIS — M47816 Spondylosis without myelopathy or radiculopathy, lumbar region: Secondary | ICD-10-CM | POA: Diagnosis not present

## 2016-09-06 DIAGNOSIS — I10 Essential (primary) hypertension: Secondary | ICD-10-CM | POA: Insufficient documentation

## 2016-09-06 DIAGNOSIS — E079 Disorder of thyroid, unspecified: Secondary | ICD-10-CM | POA: Insufficient documentation

## 2016-09-06 DIAGNOSIS — M545 Low back pain: Secondary | ICD-10-CM | POA: Insufficient documentation

## 2016-09-06 DIAGNOSIS — M1288 Other specific arthropathies, not elsewhere classified, other specified site: Secondary | ICD-10-CM

## 2016-09-06 MED ORDER — TRAMADOL HCL 50 MG PO TABS: 50.0000 mg | ORAL_TABLET | Freq: Three times a day (TID) | ORAL | 1 refills | Status: AC | PRN

## 2016-09-06 NOTE — Patient Instructions (Signed)
PLEASE FEEL FREE TO CALL OUR OFFICE WITH ANY PROBLEMS OR QUESTIONS ([phone])      

## 2016-09-06 NOTE — Progress Notes (Signed)
Subjective:    Patient ID: Phillip Merritt, male    DOB: Apr 21, 1961, 56 y.o.   MRN: [id number]  HPI   Phillip Merritt is here in follow up of his chronic pain. His back pain ebbs and flows with some days worse than others. He doesn't notice any specific pattern to it. He is doing some stretching and aerobic exercise at the gym on a limited basis. His pain levels really haven't changed. He hasn't started PT yet because his FMLA was not yet approved. He is using tramadol to help with pain control. He stopped the diclofenac per my direction because of ongoing hypertension. He was just placed on zestiretic by his pcp.  Pain Inventory Average Pain 7 Pain Right Now 6 My pain is sharp, stabbing and aching  In the last 24 hours, has pain interfered with the following? General activity 8 Relation with others 6 Enjoyment of life 7 What TIME of day is your pain at its worst? daytime Sleep (in general) Fair  Pain is worse with: walking, bending, sitting, standing and some activites Pain improves with: heat/ice and injections Relief from Meds: 4  Mobility Do you have any goals in this area?  no  Function employed # of hrs/week 40 what is your job? [employer] Do you have any goals in this area?  no  Neuro/Psych No problems in this area  Prior Studies Any changes since last visit?  no  Physicians involved in your care Any changes since last visit?  no   Family History  Problem Relation Age of Onset  . Diabetes Father    Social History   Social History  . Marital status: Divorced    Spouse name: N/A  . Number of children: N/A  . Years of education: N/A   Social History Main Topics  . Smoking status: Never Smoker  . Smokeless tobacco: Never Used  . Alcohol use No  . Drug use: No  . Sexual activity: Not Asked   Other Topics Concern  . None   Social History Narrative  . None   Past Surgical History:  Procedure Laterality Date  . HAND SURGERY    . HERNIA REPAIR    .  KNEE SURGERY     Past Medical History:  Diagnosis Date  . Hypertension   . Thyroid disease    BP (!) 156/117 Comment: started on new BP med today  Pulse (!) 109   Resp 14   SpO2 97%   Opioid Risk Score:   Fall Risk Score:  `1  Depression screen PHQ 2/9  [facility] 09/06/2016 08/16/2015 02/17/2015  Decreased Interest 3 3 3   Down, Depressed, Hopeless 0 0 0  PHQ - 2 Score 3 3 3     Review of Systems  Constitutional: Negative.   HENT: Negative.   Eyes: Negative.   Respiratory: Negative.   Cardiovascular: Negative.   Gastrointestinal: Negative.   Endocrine: Negative.   Genitourinary: Negative.   Musculoskeletal: Negative.   Skin: Negative.   Allergic/Immunologic: Negative.   Neurological: Negative.   Hematological: Negative.   Psychiatric/Behavioral: Negative.   All other systems reviewed and are negative.      Objective:   Physical Exam  Constitutional: He is oriented to person, place, and time. He appears well-developed and well-nourished.  HENT:  Head: Normocephalic.  Eyes: Pupils are equal, round, and reactive to light.  Neck: Normal range of motion.  Heart: RRR Musculoskeletal: low back tender to palpation. Extension and facet  maneuvers increase pain  Normal gait.   Neuro: CN normal.  Strength 5/5. Sensory exam normal  .  Assessment & Plan:  1. Chronic LBP. Which remains consistent with lumbar facet arthropathy--- left lower lumbar spine.  2. HTN  3. Hx of left patellar tendon rupture--     Plan:  1. Continue with pilates and facet exercises and HEP as possible.  2. Robaxin 500mg --prn spasms 3. Will continue to hold diclofenac for now given BP issues. If bp under better control with medication we could consider resuming.  4. Tramadol 50mg  q6 prn #60.--needed refill today 5.  Begin outpt PT in [place]. Advised him to begin ASAP  6. Consider follow up MBB's based on response to therapy 7. I will see him back in about  2months. 15 minutes of face to face patient care time were spent during this visit. All questions were encouraged and answered.

## 2016-11-08 ENCOUNTER — Encounter
Payer: BLUE CROSS/BLUE SHIELD | Attending: Physical Medicine & Rehabilitation | Admitting: Physical Medicine & Rehabilitation

## 2016-11-08 DIAGNOSIS — G8929 Other chronic pain: Secondary | ICD-10-CM | POA: Insufficient documentation

## 2016-11-08 DIAGNOSIS — I10 Essential (primary) hypertension: Secondary | ICD-10-CM | POA: Insufficient documentation

## 2016-11-08 DIAGNOSIS — M545 Low back pain: Secondary | ICD-10-CM | POA: Insufficient documentation

## 2016-11-08 DIAGNOSIS — E079 Disorder of thyroid, unspecified: Secondary | ICD-10-CM | POA: Insufficient documentation

## 2017-03-21 ENCOUNTER — Encounter: Payer: BLUE CROSS/BLUE SHIELD | Admitting: Physical Medicine & Rehabilitation

## 2017-03-26 ENCOUNTER — Telehealth: Payer: Self-pay | Admitting: *Deleted

## 2017-03-26 NOTE — Telephone Encounter (Signed)
Mr Phillip Merritt called to reschedule his appt tomorrow.  He was last to his last appt and had to reschedule it on 03/21/17.  Mr Phillip Merritt has been told he was going to be discharged from the clinic for no shows and cancelling appt.  He changed his mind and said that he WOULD be at his appt tomorrow to see Dr Riley Kill.  Phillip Merritt has been given a verbal warning (and will receive written warning tomorrow) that if he cancels or no shows, or is late to his appt going forward he will be discharged from the clinic.  He verbalized understanding.

## 2017-03-27 ENCOUNTER — Encounter: Payer: Self-pay | Admitting: Physical Medicine & Rehabilitation

## 2017-03-27 ENCOUNTER — Telehealth: Payer: Self-pay | Admitting: Physical Medicine & Rehabilitation

## 2017-03-27 ENCOUNTER — Encounter
Payer: BLUE CROSS/BLUE SHIELD | Attending: Physical Medicine & Rehabilitation | Admitting: Physical Medicine & Rehabilitation

## 2017-03-27 ENCOUNTER — Ambulatory Visit
Admission: RE | Admit: 2017-03-27 | Discharge: 2017-03-27 | Disposition: A | Discharge status: Home / Self Care | Payer: BLUE CROSS/BLUE SHIELD | Source: Ambulatory Visit | Attending: Physical Medicine & Rehabilitation | Admitting: Physical Medicine & Rehabilitation

## 2017-03-27 VITALS — BP 137/92 | HR 102

## 2017-03-27 DIAGNOSIS — M47816 Spondylosis without myelopathy or radiculopathy, lumbar region: Secondary | ICD-10-CM | POA: Diagnosis not present

## 2017-03-27 DIAGNOSIS — M545 Low back pain: Secondary | ICD-10-CM | POA: Diagnosis present

## 2017-03-27 DIAGNOSIS — I10 Essential (primary) hypertension: Secondary | ICD-10-CM | POA: Insufficient documentation

## 2017-03-27 DIAGNOSIS — E079 Disorder of thyroid, unspecified: Secondary | ICD-10-CM | POA: Diagnosis not present

## 2017-03-27 DIAGNOSIS — M4696 Unspecified inflammatory spondylopathy, lumbar region: Secondary | ICD-10-CM

## 2017-03-27 DIAGNOSIS — G8929 Other chronic pain: Secondary | ICD-10-CM | POA: Diagnosis not present

## 2017-03-27 MED ORDER — CYCLOBENZAPRINE HCL 5 MG PO TABS: 5.0000 mg | ORAL_TABLET | Freq: Three times a day (TID) | ORAL | 3 refills | Status: AC | PRN

## 2017-03-27 NOTE — Progress Notes (Signed)
Subjective:    Patient ID: Phillip Merritt, male    DOB: 1961-06-25, 56 y.o.   MRN: [id number]  HPI   Phillip Merritt is here in follow up of his low back pain.  He went for PT for about 4 weeks and felt that he was not getting any relief which was long lasting. He would feel good for a few days after therpay and then the pain would return. He didn't feel that there was much rhyme or reason to what they were doing. Told me they were working on disc related exercises!!  Rest and icing make the pain a bit better. The tramadol helps slightly. He was taken off the diclofenac due to renal issues. I believe we had held it due to his HTN before.   He continues to work full time. He is working out still but trying to be more careful with routine to avoid irritating back.     Pain Inventory Average Pain 7 Pain Right Now 3 My pain is sharp  In the last 24 hours, has pain interfered with the following? General activity 7 Relation with others 7 Enjoyment of life 7 What TIME of day is your pain at its worst? daytime Sleep (in general) Poor  Pain is worse with: walking, sitting and standing Pain improves with: injections Relief from Meds: .  Mobility Do you have any goals in this area?  no  Function employed # of hrs/week 37.5  Neuro/Psych No problems in this area  Prior Studies Any changes since last visit?  no  Physicians involved in your care Any changes since last visit?  no   Family History  Problem Relation Age of Onset  . Diabetes Father    Social History   Social History  . Marital status: Divorced    Spouse name: N/A  . Number of children: N/A  . Years of education: N/A   Social History Main Topics  . Smoking status: Never Smoker  . Smokeless tobacco: Never Used  . Alcohol use No  . Drug use: No  . Sexual activity: Not on file   Other Topics Concern  . Not on file   Social History Narrative  . No narrative on file   Past Surgical History:  Procedure  Laterality Date  . HAND SURGERY    . HERNIA REPAIR    . KNEE SURGERY     Past Medical History:  Diagnosis Date  . Hypertension   . Thyroid disease    There were no vitals taken for this visit.  Opioid Risk Score:   Fall Risk Score:  `1  Depression screen PHQ 2/9  Depression screen [facility] 2/9 09/06/2016 08/16/2015 02/17/2015  Decreased Interest Down, Depressed, Hopeless 0 0 0  PHQ - 2 Score Review of Systems  Constitutional: Negative.   HENT: Negative.   Eyes: Negative.   Respiratory: Negative.   Cardiovascular: Negative.   Gastrointestinal: Negative.   Endocrine: Negative.   Genitourinary: Negative.   Musculoskeletal: Negative.   Skin: Negative.   Allergic/Immunologic: Negative.   Neurological: Negative.   Hematological: Negative.   Psychiatric/Behavioral: Negative.   All other systems reviewed and are negative.      Objective:   Physical Exam  Constitutional: He is oriented to person, place, and time. He appears well-developed and well-nourished.  HENT:  Head: Normocephalic.  Eyes: Pupils are equal, round, and reactive to light.  Neck: Normal range of motion.  Heart: RRR Musculoskeletal: low back tender to palpation especially along L3-L5. Extension and facet maneuvers increase pain left more than right. Mild to moderate muscle spasm left lumbar paraspinals Normal gait.   Neuro: CN normal.  Strength 5/5. Sensory exam normal  .  Assessment & Plan:  1. Chronic LBP. Which remains consistent with lumbar facet arthropathy--- left lower lumbar spine is most involved.  2. HTN  3. Hx of left patellar tendon rupture--     Plan:  1. Continue with pilates and facet exercises at home.  2. Ordered flexeril  q8 prn spasms 3. Will continue to hold diclofenac for now given BP and kidney issues 4. Tramadol  q6 prn #60.--no refill today 5. orderd xr of lumbar spine to assess disc spaces and facets. If facets are consistent with exam, then  will discuss MBB with Dr. Wynn Banker. Exam remains consistent with prior problem  6. . I will see him back pending above. 15 minutes of face to face patient care time were spent during this visit. All questions were encouraged and answered.   We discussed his attendance here at the office an intolerance of further cancellations and no-shows. He voiced and understanding.

## 2017-03-27 NOTE — Telephone Encounter (Signed)
Xray is fairly unremarkable. I will need to d/w Dr. Wynn Banker regarding feasibility of pursuing MBB's based on history, exam, old MRI without ordering a new MRI.

## 2017-03-27 NOTE — Patient Instructions (Signed)
PLEASE FEEL FREE TO CALL OUR OFFICE WITH ANY PROBLEMS OR QUESTIONS ([phone])      

## 2017-03-29 NOTE — Telephone Encounter (Signed)
Left message giving Mr Simkin the information from Dr Riley Kill.

## 2017-04-23 ENCOUNTER — Telehealth: Payer: Self-pay

## 2017-04-23 ENCOUNTER — Telehealth: Payer: Self-pay | Admitting: Physical Medicine & Rehabilitation

## 2017-04-23 NOTE — Telephone Encounter (Signed)
I told him I would review situation with Dr. Wynn BankerKirsteins. I forgot to do that honestly. I reviewed with him just now and given that it was only 2014 that MBB's were done, he can repeat again in the previous area given stable xr findings and consistent exam finding. Please arrange Left L4-5, L5-S1 MBB with Dr. Wynn BankerKirsteins, thanks

## 2017-04-23 NOTE — Telephone Encounter (Signed)
Mr Phillip Merritt is calling back about possibly having an MRI or injection.  After x ray resulted Dr Riley KillSwartz was going to discuss with Dr Wynn BankerKirsteins about possible injections or if he needs MRI.  Please advise.

## 2017-04-23 NOTE — Telephone Encounter (Signed)
Patient wasn't clear but wants to talk about an MRI and injection.

## 2017-04-23 NOTE — Telephone Encounter (Signed)
Patient called and left voicemail wanting to know when his mri will be scheduled??  Don't see that you put in an order for mri?

## 2017-04-24 NOTE — Telephone Encounter (Signed)
Left voicemail to get the injection scheduled with Dr. Wynn BankerKirsteins.

## 2017-05-02 ENCOUNTER — Telehealth: Payer: Self-pay

## 2017-05-02 NOTE — Telephone Encounter (Signed)
Patient called wanting to know if Dr. Riley KillSwartz had a chance to fill out the FMLA forms that were faxed over and if so can you fax them back at [phone].

## 2017-05-03 ENCOUNTER — Telehealth: Payer: Self-pay | Admitting: Physical Medicine & Rehabilitation

## 2017-05-03 NOTE — Telephone Encounter (Signed)
pATIENT CALLED AND REQ DISABILITY FORMS BE FAXED TO HIM - DID THIS [phone] BUT GOT NO CONFIRMATION

## 2017-05-10 ENCOUNTER — Ambulatory Visit (HOSPITAL_BASED_OUTPATIENT_CLINIC_OR_DEPARTMENT_OTHER): Payer: BLUE CROSS/BLUE SHIELD | Admitting: Physical Medicine & Rehabilitation

## 2017-05-10 ENCOUNTER — Encounter: Payer: BLUE CROSS/BLUE SHIELD | Attending: Physical Medicine & Rehabilitation

## 2017-05-10 ENCOUNTER — Other Ambulatory Visit: Payer: Self-pay

## 2017-05-10 ENCOUNTER — Encounter: Payer: Self-pay | Admitting: Physical Medicine & Rehabilitation

## 2017-05-10 VITALS — BP 132/93 | HR 100 | Resp 14

## 2017-05-10 DIAGNOSIS — M545 Low back pain: Secondary | ICD-10-CM | POA: Diagnosis present

## 2017-05-10 DIAGNOSIS — G8929 Other chronic pain: Secondary | ICD-10-CM | POA: Insufficient documentation

## 2017-05-10 DIAGNOSIS — I10 Essential (primary) hypertension: Secondary | ICD-10-CM | POA: Diagnosis not present

## 2017-05-10 DIAGNOSIS — E079 Disorder of thyroid, unspecified: Secondary | ICD-10-CM | POA: Insufficient documentation

## 2017-05-10 DIAGNOSIS — M47816 Spondylosis without myelopathy or radiculopathy, lumbar region: Secondary | ICD-10-CM | POA: Diagnosis not present

## 2017-05-10 NOTE — Progress Notes (Signed)
Left Lumbar L3, L4  medial branch blocks and L 5 dorsal ramus injection under fluoroscopic guidance   Indication: Left Lumbar pain which is not relieved by medication management or other conservative care and interfering with self-care and mobility.  Informed consent was obtained after describing risks and benefits of the procedure with the patient, this includes bleeding, bruising, infection, paralysis and medication side effects.  The patient wishes to proceed and has given written consent.  The patient was placed in a prone position.  The lumbar area was marked and prepped with Betadine.  One mL of 1% lidocaine was injected into each of 3 areas into the skin and subcutaneous tissue.  Then a 22-gauge 3.5in spinal needle was inserted targeting the junction of the left S1 superior articular process and sacral ala junction.  Needle was advanced under fluoroscopic guidance.  Bone contact was made. Isovue 200 was injected x 0.5 mL demonstrating no intravascular uptake.  Then a solution containing  2% MPF lidocaine was injected x 0.5 mL.  Then the left L5 superior articular process in transverse process junction was targeted.  Bone contact was made. Isovue 200 was injected x 0.5 mL demonstrating no intravascular uptake.  Then a solution containing 2% MPF lidocaine was injected x 0.5 mL.  Then the left L4 superior articular process in transverse process junction was targeted.  Bone contact was made.  Isovue 200 was injected x 0.5 mL demonstrating no intravascular uptake.  Then a solution containing  2% MPF lidocaine was injected x 0.5 mL.  Patient tolerated procedure well.  Post procedure instructions were given. 

## 2017-05-10 NOTE — Patient Instructions (Signed)

## 2017-05-10 NOTE — Progress Notes (Signed)
  PROCEDURE RECORD [facility]   Name: Phillip Merritt DOB:[birth year] MRN: [id number]  Date:05/10/2017  Physician: Claudette LawsAndrew Kirsteins, MD    Nurse/CMA: Jazmyn Offner, CMA  Allergies:  Allergies  Allergen Reactions  . Voltaren [Diclofenac Sodium]     nephrotoxicity    Consent Signed: Yes.    Is patient diabetic? No.  CBG today?   Pregnant: No. LMP: No LMP for male patient. (age 56-55)  Anticoagulants: no Anti-inflammatory: no Antibiotics: no  Procedure: left medial branch block  Position: Prone Start Time: 10:24am  End Time: 10:31am  Fluoro Time: 29  RN/CMA Delight Bickle, CMA Linh Hedberg, CMA    Time 10:20am 10:35am    BP 132/93 136/91    Pulse 100 92    Respirations 14 14    O2 Sat 94 97    S/S 6 6    Pain Level 4/10 1/10     D/C home with friend Inocencio Homes([name]), patient A & O X 3, D/C instructions reviewed, and sits independently.

## 2017-06-11 ENCOUNTER — Encounter: Payer: BLUE CROSS/BLUE SHIELD | Admitting: Physical Medicine & Rehabilitation

## 2017-06-18 ENCOUNTER — Encounter: Payer: Self-pay | Admitting: Physical Medicine & Rehabilitation

## 2017-06-18 ENCOUNTER — Encounter
Payer: BLUE CROSS/BLUE SHIELD | Attending: Physical Medicine & Rehabilitation | Admitting: Physical Medicine & Rehabilitation

## 2017-06-18 VITALS — BP 111/81 | HR 111

## 2017-06-18 DIAGNOSIS — S76302S Unspecified injury of muscle, fascia and tendon of the posterior muscle group at thigh level, left thigh, sequela: Secondary | ICD-10-CM

## 2017-06-18 DIAGNOSIS — M545 Low back pain: Secondary | ICD-10-CM | POA: Diagnosis present

## 2017-06-18 DIAGNOSIS — G8929 Other chronic pain: Secondary | ICD-10-CM | POA: Insufficient documentation

## 2017-06-18 DIAGNOSIS — M47816 Spondylosis without myelopathy or radiculopathy, lumbar region: Secondary | ICD-10-CM

## 2017-06-18 DIAGNOSIS — E079 Disorder of thyroid, unspecified: Secondary | ICD-10-CM | POA: Insufficient documentation

## 2017-06-18 DIAGNOSIS — I10 Essential (primary) hypertension: Secondary | ICD-10-CM | POA: Diagnosis not present

## 2017-06-18 NOTE — Progress Notes (Signed)
Subjective:    Patient ID: Phillip Merritt, male    DOB: [birth year], 56 y.o.   MRN: [id number]  HPI   Phillip Merritt is here in follow up of his chronic back pain. He had 2.5 weeks of complete (nearly 100%) pain with the MBB's. Since then he has had some increase in his low back pain but not nearly to the level it was. He has been experiencing pain in the left hip/buttocks more recently which lasted from around 12/5--12/13-14. The area was painful in bed, sitting or bearing weight on the left leg. Even having a BM was painful. Flexeril did not help the area. Today it's not bothering him at all. He doesn't recall any movement or activity which would have triggered.     Pain Inventory Average Pain 9 Pain Right Now 3 My pain is sharp and aching  In the last 24 hours, has pain interfered with the following? General activity 4 Relation with others 4 Enjoyment of life 4 What TIME of day is your pain at its worst? daytime Sleep (in general) Poor  Pain is worse with: walking, sitting, standing and some activites Pain improves with: injections Relief from Meds: 1  Mobility walk without assistance ability to climb steps?  yes do you drive?  yes  Function employed # of hrs/week 40  Neuro/Psych No problems in this area  Prior Studies Any changes since last visit?  no  Physicians involved in your care Any changes since last visit?  no   Family History  Problem Relation Age of Onset  . Diabetes Father    Social History   Socioeconomic History  . Marital status: Divorced    Spouse name: None  . Number of children: None  . Years of education: None  . Highest education level: None  Social Needs  . Financial resource strain: None  . Food insecurity - worry: None  . Food insecurity - inability: None  . Transportation needs - medical: None  . Transportation needs - non-medical: None  Occupational History  . None  Tobacco Use  . Smoking status: Never Smoker  . Smokeless  tobacco: Never Used  Substance and Sexual Activity  . Alcohol use: No  . Drug use: No  . Sexual activity: None  Other Topics Concern  . None  Social History Narrative  . None   Past Surgical History:  Procedure Laterality Date  . HAND SURGERY    . HERNIA REPAIR    . KNEE SURGERY     Past Medical History:  Diagnosis Date  . Hypertension   . Thyroid disease    There were no vitals taken for this visit.  Opioid Risk Score:   Fall Risk Score:  `1  Depression screen PHQ 2/9  [facility] 09/06/2016 08/16/2015 02/17/2015  Decreased Interest 3 3 3   Down, Depressed, Hopeless 0 0 0  PHQ - 2 Score 3 3 3      Review of Systems  Constitutional: Negative.   HENT: Negative.   Eyes: Negative.   Respiratory: Negative.   Cardiovascular: Negative.   Gastrointestinal: Negative.   Endocrine: Negative.   Genitourinary: Negative.   Musculoskeletal: Positive for back pain.  Skin: Negative.   Allergic/Immunologic: Negative.   Neurological: Negative.   Hematological: Negative.   Psychiatric/Behavioral: Negative.   All other systems reviewed and are negative.      Objective:   Physical Exam  Constitutional: He is oriented to person, place, and time. He appears well-developed and  well-nourished.  HENT:  Head: Normocephalic.  Eyes: Pupils are equal, round, and reactive to light.  Neck: Normal range of motion.  Heart: RRR Chest: Normal effort Musculoskeletal: low back tender to palpation especially along L3-L5. Extension and facet maneuvers still cause painleft more than right---much improved however. Mild to moderate muscle spasm left lumbar paraspinals. He has no tenderness along origination of hamstrings at left ischium. No pain with flexion or extension in the hip.  Normal gait.   Neuro: CN normal. Strength 5/5. Sensory exam normal  .  Assessment & Plan:  1. Chronic LBP. Which remains consistent with lumbar facet arthropathy--- left lower lumbar spine is  most involved.   Patient had good results with left L4-L5 medial branch blocks per Dr. Illa LevelKersten's last month. 2. HTN  3. Hx of left patellar tendon rupture--  4.  New left gluteal pain.  May be related to hamstring origination.  No pain on examination today  Plan:  1. Continue with pilates and facet exercises at home. extensive hip and hamstring stretches were provided today for left hip. 2. can stay with flexeril prn for spasms 3. No diclofenac  given BP and kidney issues 4. Tramadol 50mg  q6.  He is using this sparingly now 5. Hold off on further injections at this time.  Next procedure we would pursue for his low back would be radiofrequency ablations 6. . I will see him back  in about 2 months. 15 minutes of face to face patient care time were spent during this visit. All questions were encouraged and answered.

## 2017-06-18 NOTE — Patient Instructions (Addendum)
PLEASE FEEL FREE TO CALL OUR OFFICE WITH ANY PROBLEMS OR QUESTIONS [phone])  HAVE A HAPPY HOLIDAYS!                     [name]^                  [name]^^                ^ ^ ^             ^ ^ ^ ^ ^           ^ ^ ^ ^ ^ ^ ^        [place]^ ^ [place]^ ^ [place]^ ^ [place]^ ^ [place]^      ^ [place]^ ^ [place]^ ^ [place]^ ^ [phone]^ ^ ^ [name]^                ^^^^                [name]^^^^                ^^^^    WORK ON HEAT AND DAILY STRETCHING OF YOUR HIPS AND HAMSTRINGS.

## 2017-07-06 ENCOUNTER — Telehealth: Payer: Self-pay | Admitting: Physical Medicine & Rehabilitation

## 2017-07-06 NOTE — Telephone Encounter (Signed)
Phillip DykeAlbert Merritt Short Term disability office has a question about the documentation we sent them they would like to know if they can speak with you on either the 7th or 8th  Ref # [id number]

## 2017-07-13 ENCOUNTER — Telehealth: Payer: Self-pay | Admitting: Physical Medicine & Rehabilitation

## 2017-07-13 NOTE — Telephone Encounter (Signed)
Dr Lahoma RockerSherman Phone [phone] called and ask you return a call to him.  He has a question he would like to clarify about the disability papers you completed.  Thanks

## 2017-07-13 NOTE — Telephone Encounter (Signed)
I will not be able to get to that till next week.  It would be helpful to know what he is actually questioning.  Also I need the forms in question and hand before I call him.  Thank you

## 2017-08-20 ENCOUNTER — Telehealth: Payer: Self-pay | Admitting: Physical Medicine & Rehabilitation

## 2017-08-20 ENCOUNTER — Encounter: Payer: BLUE CROSS/BLUE SHIELD | Admitting: Physical Medicine & Rehabilitation

## 2017-08-20 NOTE — Telephone Encounter (Signed)
Letter sent via MY Chart and mailed

## 2017-08-20 NOTE — Telephone Encounter (Signed)
Pt no showed again for visit. He is discharged from clinic

## 2018-02-13 IMAGING — CR DG LUMBAR SPINE COMPLETE W/ BEND
7 series · 7 of 7 positions shown · non-contrast
Comparison: Lumbar spine series of February 17, 2015

CLINICAL DATA: One year of low back pain with some left-sided back
pain radiating to the leg.

EXAM:
LUMBAR SPINE - COMPLETE WITH BENDING VIEWS

[w lumbar spine ap]
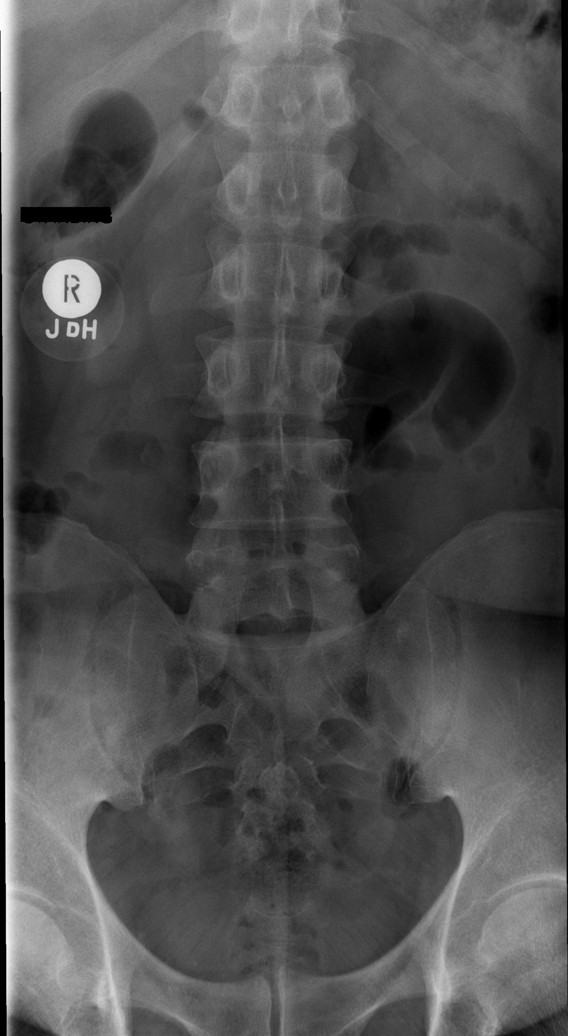

[w lumbar spine obl (1 of 2)]
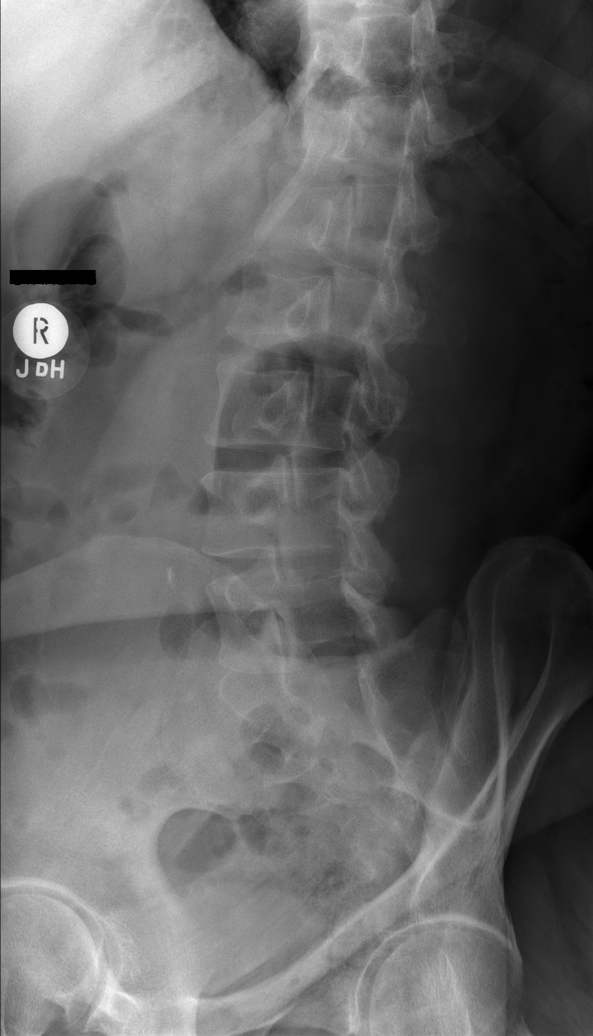

[w lumbar spine obl (2 of 2)]
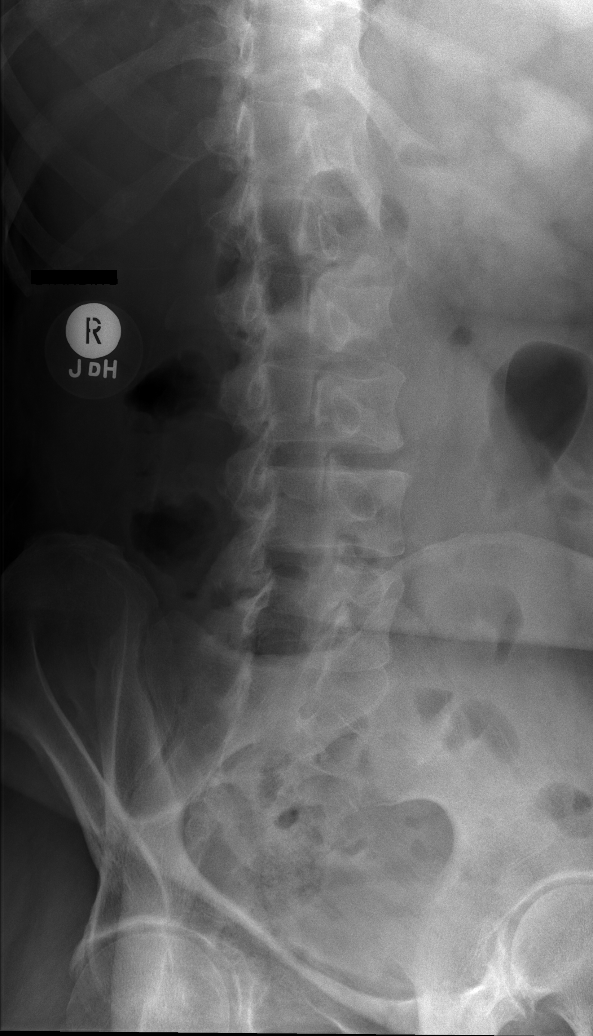

[w lumbar spine lat]
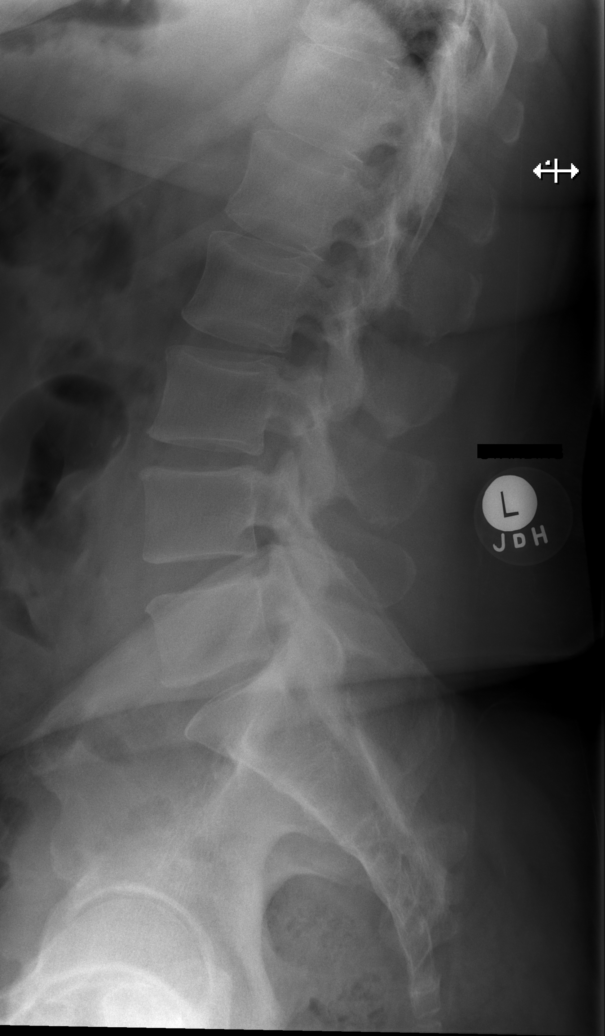

[w lumbar l-5 s-1 spot]
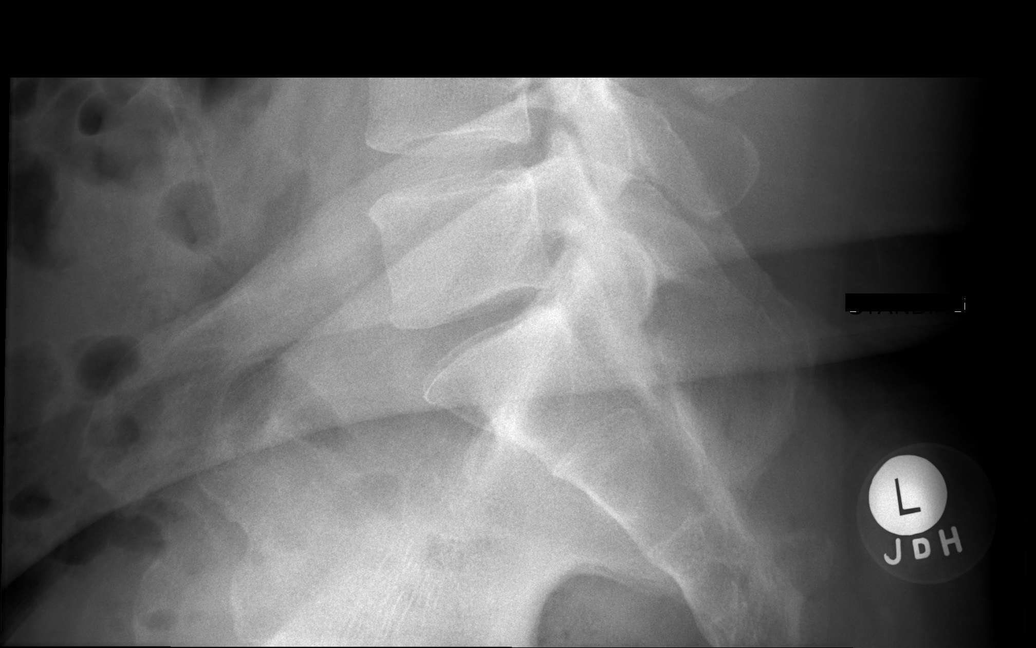

[w lumbar spine flexion]
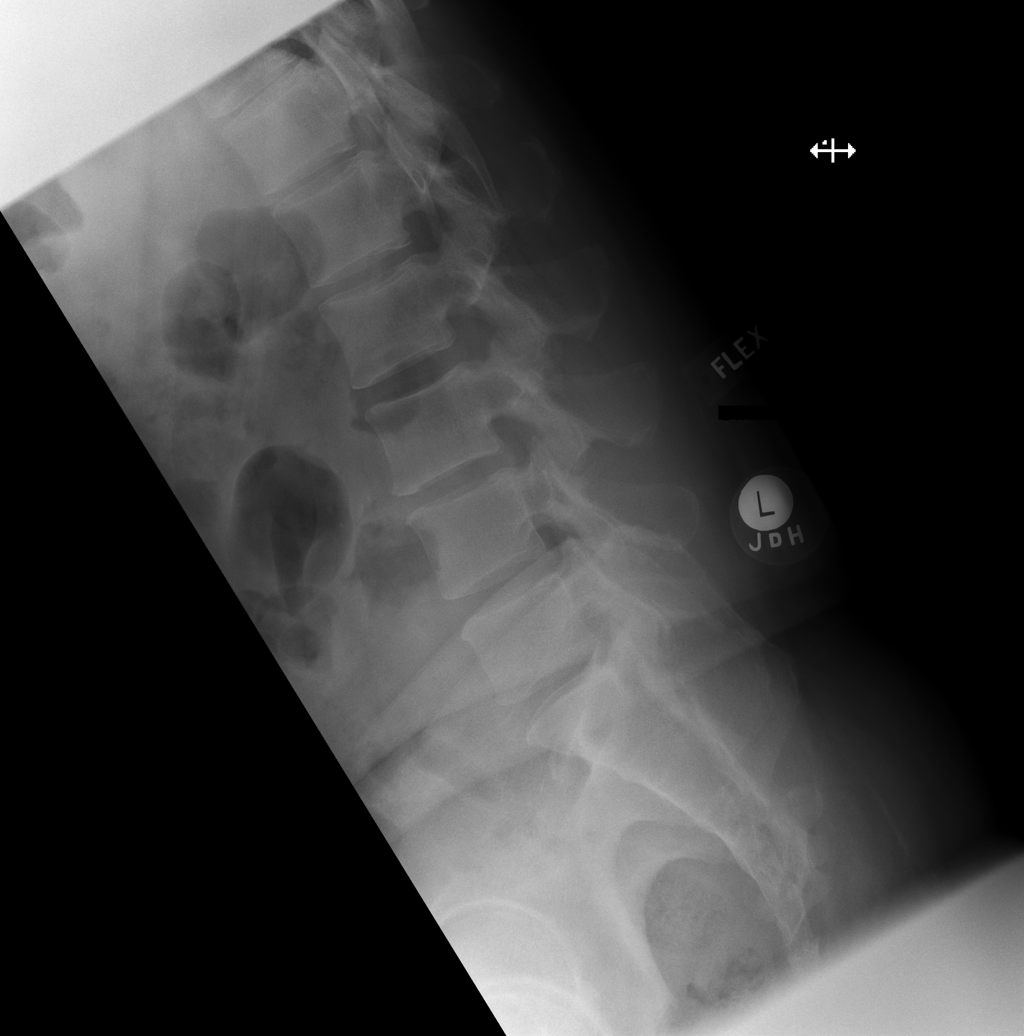

[w lumbar spine extension]
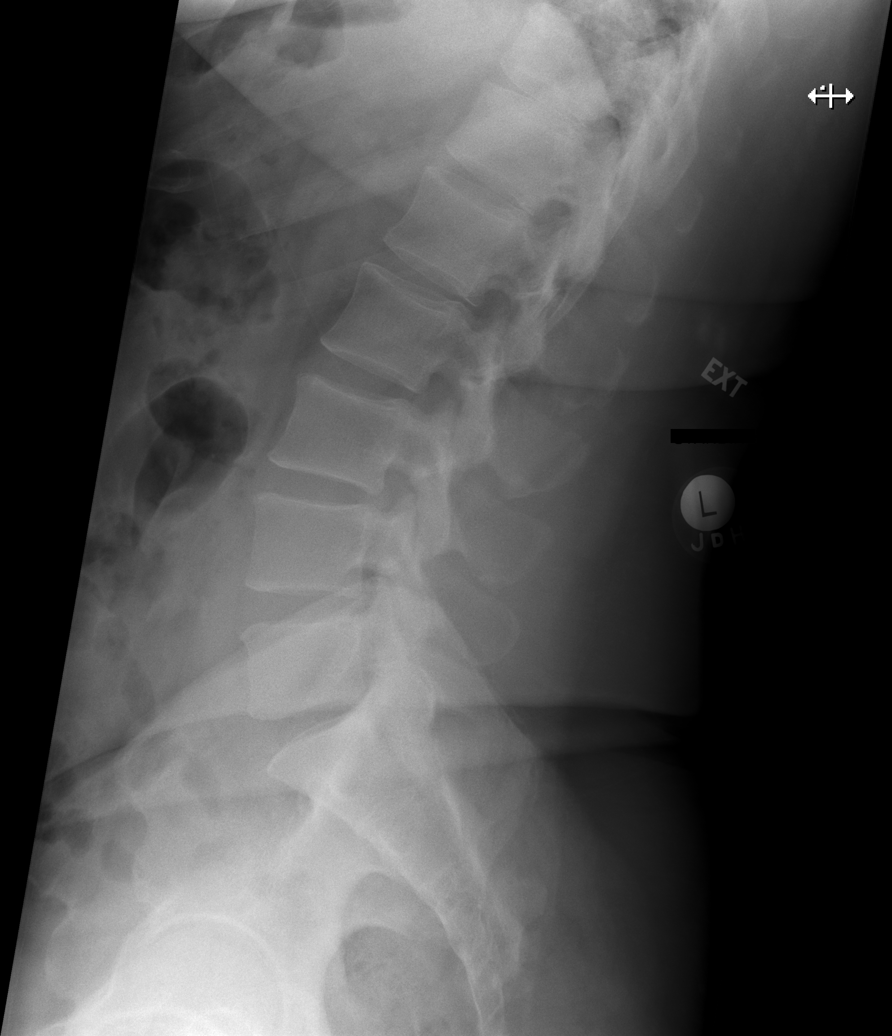

[7 of 7 positions shown; findings below may reference images not displayed]

FINDINGS: The lumbar vertebral bodies are preserved in height. The pedicles
and transverse processes are intact. There is no spondylolisthesis.
The disc space heights are well maintained. The observed portions of
the sacrum are normal.
IMPRESSION: There is no acute or significant chronic bony abnormality of the
lumbar spine.

## 2019-10-16 ENCOUNTER — Ambulatory Visit: Admit: 2019-10-16 | Payer: BLUE CROSS/BLUE SHIELD | Primary: Internal Medicine

## 2019-10-16 DIAGNOSIS — Z23 Encounter for immunization: Secondary | ICD-10-CM

## 2019-11-06 ENCOUNTER — Ambulatory Visit: Admit: 2019-11-06 | Payer: PRIVATE HEALTH INSURANCE | Primary: Internal Medicine

## 2019-11-06 DIAGNOSIS — Z23 Encounter for immunization: Secondary | ICD-10-CM

## 2019-11-06 NOTE — Telephone Encounter (Signed)
Opened in error

## 2022-05-26 ENCOUNTER — Encounter: Admit: 2022-05-26 | Payer: PRIVATE HEALTH INSURANCE | Attending: Internal Medicine | Primary: Internal Medicine

## 2022-05-26 ENCOUNTER — Inpatient Hospital Stay: Admit: 2022-05-26 | Discharge: 2022-05-26 | Payer: BLUE CROSS/BLUE SHIELD | Primary: Internal Medicine

## 2022-05-26 DIAGNOSIS — M25431 Effusion, right wrist: Secondary | ICD-10-CM

## 2022-07-04 ENCOUNTER — Ambulatory Visit: Admit: 2022-07-04 | Payer: BLUE CROSS/BLUE SHIELD | Attending: Orthopedic Surgery | Primary: Internal Medicine

## 2022-09-22 ENCOUNTER — Ambulatory Visit: Admit: 2022-09-22 | Payer: PRIVATE HEALTH INSURANCE | Primary: Internal Medicine

## 2022-09-22 ENCOUNTER — Inpatient Hospital Stay: Admit: 2022-09-22 | Discharge: 2022-09-22 | Payer: BLUE CROSS/BLUE SHIELD | Primary: Internal Medicine

## 2022-09-22 DIAGNOSIS — G5601 Carpal tunnel syndrome, right upper limb: Secondary | ICD-10-CM

## 2022-09-22 DIAGNOSIS — M25539 Pain in unspecified wrist: Secondary | ICD-10-CM

## 2022-10-10 ENCOUNTER — Encounter: Admit: 2022-10-10 | Payer: BLUE CROSS/BLUE SHIELD | Attending: Orthopedic Surgery | Primary: Internal Medicine

## 2022-10-12 ENCOUNTER — Telehealth: Admit: 2022-10-12 | Payer: PRIVATE HEALTH INSURANCE | Attending: Orthopedic Surgery | Primary: Internal Medicine

## 2022-11-10 ENCOUNTER — Inpatient Hospital Stay
Admit: 2022-11-10 | Discharge: 2022-11-10 | Payer: BLUE CROSS/BLUE SHIELD | Attending: Orthopedic Surgery | Primary: Internal Medicine

## 2022-11-10 ENCOUNTER — Encounter: Admit: 2022-11-10 | Payer: PRIVATE HEALTH INSURANCE | Attending: Orthopedic Surgery | Primary: Internal Medicine

## 2022-11-10 DIAGNOSIS — D649 Anemia, unspecified: Secondary | ICD-10-CM

## 2022-11-10 DIAGNOSIS — E7800 Pure hypercholesterolemia, unspecified: Secondary | ICD-10-CM

## 2022-11-10 DIAGNOSIS — N189 Chronic kidney disease, unspecified: Secondary | ICD-10-CM

## 2022-11-10 DIAGNOSIS — I10 Essential (primary) hypertension: Secondary | ICD-10-CM

## 2022-11-10 MED ORDER — SODIUM CHLORIDE 0.9 % IRRIGATION SOLUTION
0.9 % irrigation | Status: CP | PRN
Start: 2022-11-10 — End: ?
  Administered 2022-11-10: 12:00:00 0.9 % irrigation

## 2022-11-10 MED ORDER — BACITRACIN ZINC 500 UNIT/GRAM TOPICAL OINTMENT
500 unit/gram | Status: DC | PRN
Start: 2022-11-10 — End: 2022-11-10
  Administered 2022-11-10: 13:00:00 500 unit/gram via TOPICAL

## 2022-11-10 NOTE — Other
[facility] - DO NOT COPY WITHOUT APPROPRIATE AUTHORIZATION Name: Marcial Pless: [facility] PERI EUnit No: [id number] CSN: [id number] Service Area: SurDate of Birth: 1962/09/05Date of Adm: 11/10/22 Dictated: 10:01 AM Dictated by: Riley Churches MD  DATE OF PROCEDURE/SURGERY: 11/10/22 OPERATION:Right carpal tunnel release. Arnaldo Natal, M.D.  ASSISTANT:None  ANESTHESIA:Local PREOP DIAGNOSIS:Right carpal tunnel syndrome. POSTOP DIAGNOSIS:Right carpal tunnel syndrome. ESTIMATED BLOOD LOSS:N/A SPECIMENS REMOVED:None.  COMPLICATIONS: None.  PROCEDURE:  The patient was placed supine on the table.  A time-out was performed.  The affected upper extremity was prepped and draped in usual sterile manner after infiltration of 0.5% Marcaine into the palm of the hand overlying the transverse carpal ligament.The limb was elevated exsanguinated and the tourniquet inflated to 250 millimeters of mercuryA standard longitudinal palmar incision paralleling the 3rd web space was made centered over the transverse carpal ligament starting at Kaplan's line extending proximally I sharply incised through skin subcutaneous tissue and palmar fascia identifying the transverse carpal ligament.  A small nick was made in the center portion of the ligament identifying the the underlying median nerve, with a 15 blade.  I then divided the distal half the ligament with a 15 blade to the level of the distal fat pad making certain the median nerve distally was completely decompressed.  I then dissected out the proximal half of the ligament, placed retractors to expose ligament, and used a Therapist, nutritional to create a passage above and below the proximal half of the ligament freeing the median nerve from the under surface of the ligament.  Then using a combination of a 15 blade and tenotomy scissors I completed division of the proximal half of the ligament in continuity with the distal antebrachial forearm fascia to a point just proximal to the wrist flexion crease.  The median nerve was inspected.  I made certain the nerve was completely decompressed from proximal to the wrist flexion crease to the distal fat.  The tourniquet was then released.  Some hyperemia of the nerve was noted.The wound was flushed with normal saline and the skin closed with 4-0 nylon sutures in a horizontal mattress fashion.  Bacitracin Adaptic and a sterile compressive dressing was applied.  The patient was then brought to recovery room in stable condition.

## 2022-11-21 ENCOUNTER — Encounter: Admit: 2022-11-21 | Payer: BLUE CROSS/BLUE SHIELD | Attending: Orthopedic Surgery | Primary: Internal Medicine

## 2023-03-23 ENCOUNTER — Encounter: Admit: 2023-03-23 | Payer: PRIVATE HEALTH INSURANCE | Primary: Internal Medicine

## 2023-03-26 ENCOUNTER — Encounter: Admit: 2023-03-26 | Payer: PRIVATE HEALTH INSURANCE | Primary: Internal Medicine

## 2023-03-27 ENCOUNTER — Telehealth: Admit: 2023-03-27 | Payer: PRIVATE HEALTH INSURANCE | Primary: Internal Medicine

## 2023-03-27 ENCOUNTER — Encounter: Admit: 2023-03-27 | Payer: PRIVATE HEALTH INSURANCE | Primary: Internal Medicine

## 2023-03-27 DIAGNOSIS — N189 Chronic kidney disease, unspecified: Secondary | ICD-10-CM

## 2023-03-27 DIAGNOSIS — Z01818 Encounter for other preprocedural examination: Secondary | ICD-10-CM

## 2023-03-27 NOTE — Progress Notes
 [facility] Transplantation CenterKidney Transplant Program- Referral ReviewREFERRING PROVIDER:  PRIMARY CARE PROVIDER: Stevenson Luna.NAME:  Jacob Luna , [id number] AGE:  62 y.o. GENDER:  male ETHNICITY:  Black Or African American ETIOLOGY:   DIALYSIS CENTER  (Not currently on dialysis) Patient Active Problem List Diagnosis SNOMED Barrera(R)  Stage 4 chronic kidney disease (HC Code) (HC CODE) (HC Code) CHRONIC KIDNEY DISEASE STAGE 4  Preoperative cardiovascular examination PATIENT ENCOUNTER STATUS  Cardiomyopathy (HC Code) CARDIOMYOPATHY  Essential hypertension ESSENTIAL HYPERTENSION  Sleep apnea SLEEP APNEA  Mixed hyperlipidemia MIXED HYPERLIPIDEMIA  Abnormal ECG ELECTROCARDIOGRAM ABNORMAL Mr Jacob Luna is a 62 y/o male patient with CKD 2/2 HTN. 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 The above patient has been referred for initial transplant evaluation.  A brief chart review has been performed at this time, findings discussed with Jacob Luna as part of referral review process.  Current Providers/Recent Testing:Jacob Luna A/P w/o contrast:Echocardiogram: 03/2020  Left ventricular systolic function is normal. The quantitative EF by 2D Simpson biplane is 65%.  There is mild concentric hypertrophy. Diastolic function could not be accurately assessed.  There is mild aortic valve sclerosis without evidence of stenosis.  There is mild mitral annular calcification. There is mild mitral regurgitation .  The absence of sufficient significant tricuspid regurgitation precludes estimation of right ventricular systolic pressure.  Compared to previous study on 05/08/2019 the ascending aorta measurement is reduced from 3.8 to 3.3cm, otherwise no significant change.Exercise stress test: 05/2019 ECG response to stress was negative maximal (peak HR > 85%). His blood pressure was normal at rest with a hypertensive response to exercise. His heart rate demonstrated an appropriate response to exercise. Overall, his exercise capacity was normal. Cardiology consult:12/30/21:Jacob Sanders,MD:IMPRESSION AND PLAN: Preoperative cardiovascular examinationPatient with stable vital signs, stable EKG and no cardiac complaints. He is at acceptable cardiovascular risk to undergo dental surgery. He should remain on his antihypertensives during the perioperative period.Abnormal EKG Patient with known history of second-degree AV block, Mobitz 1, with no significant change on EKG. We will continue to monitor. Essential hypertensionBlood pressure appears to be stable on current medications.- ECG 12 leadMixed hyperlipidemiaPatient currently on high-dose Crestor. Lipids followed by PCP.Follow-up in 6 months. Colonoscopy:4 years ago, [facility], Done by Dr. Hurman Luna Obtained From Patient:Listed/Evaluated at any other Transplant Programs:NoPotential Living Donors:NoHx of blood transfusions:NoHx of prior transplants:NoHx of substance abuse:NoHx of mental health issues:NoLiving conditions:Live w/ spouseCurrent infections (including HCV, HIV):NoOpen wounds:NoAmputations:NoFunctional status:NoSmoking:NoCVA:NoCAD:NoDM:NoCancer:NoAuto-Immune:No Surgical history:Yes, Achilles tendon sx, ruptured patella tendon sx, bil inguinal hernia repair sx in 1975 and 1982, left hand sx Education:Kidney transplant evaluation is scheduled for (date). Plan to arrive 10-15 mins early to check-in.Education will be provided on transplant, and will meet with transplant team for evaluation.No visitor restriction in place at this time, can bring one additional person.No mask requirement in place at this time, but are recommended.Visitor and mask requirements are subject to change in future if needed.Parking garage is adjacent to physicians building, bring ticket in with you to be stamped.Recommended to eat breakfast and okay to bring snack/drink to appt.Plan:Proceed to [facility].Per guidelines and protocols, the patient will require the following:[place] A/P w/o contrastEchocardiogramStress TestCardiac clearance: Pt will reach out to cardiologist to complete cardiac testingsColonoscopy report:Done 4 years ago, need to obtain report from Dr. Ephraim Luna.INCOMPLETE KIDNEY EVALUATION TASKS Due Form 2728 Obtained 04/22/2023 Contact Patient for Referral Intake and Initiate Episode  Obtain Medical Records  Jacob Luna, RN9/23/2024  03/16/2020 07/04/2022 10/10/2022 11/06/2022 11/21/2022 03/13/2023 03/23/2023 Kidney Referral Intake Information Do you have a primary care provider?       Y Who is your primary care provider?       dr babu Have you seen them in the past year?       Y Where do you receive the majority of your care?       Jacob Luna Can we reach out to your providers to get your medical records?       Yes Do you have insurance?       Yes What type of insurance do you have?       [employer] Are you a [place] citizen?       YES Are you currently on dialysis?       N Height 5' 10.5 (1.791 m) 5' 11 (1.803 m) 5' 11 (1.803 m) 5' 11 (1.803 m) 5' 11 (1.803 m) 5' 11 (1.803 m) 5' 11 (1.803 m) Weight 109 kg 100.7 kg 99.8 kg 101.9 kg 101.9 kg 101.7 kg 101.6 kg Are you listed at another transplant center?       N Have you been declined at another transplant center?       N Do you have a cardiologist?       N Do you have anyone interested in being a living donor?       N Have you ever been told you have a problem with your liver?       N Have you ever been told that you have an infection like hepatitis or HIV?       N Have you ever been told you have diabetes?       N Have you ever had a problem with your heart?       N Have you ever smoked tobacco?       N Have you ever had an amputation?       N Have you ever had a blood clot in your legs or lungs?       N Do you take medication to thin your blood?       N Have you ever had cancer?       N Have you ever had a blood transfusion?       N Have you had a prior transplant?       N Are you able to walk without using a cane or walker?       Y Are you able to walk up a flight of stairs without feeling short of breath?       Y Have you been in a nursing home or rehab unit in the past year?       N Do you need help with household activities or driving?       N

## 2023-03-27 NOTE — Telephone Encounter
 TC to pt @ [phone] to complete referral screening and provide education for [facility], unable to reach, LVM to call me back at [phone] to complete referral screening.

## 2023-03-27 NOTE — Telephone Encounter
 Caller Name: Arjuna Crouse: [phone] Ext: From: Pre KidneyMessage Type: General MessageMessage: Pt: Name: Mathis Fare FrostDOB:28-Apr-1962MD: EisenYou [facility] [place], correct?noPre or Post:preOrgan Type:kidneyRE:caller missed call from coord. Lizbett Garciagarcia last name unknown, requesting c/b from coord. RC to pt, referral screening completed, education to [facility] provided.

## 2023-03-28 ENCOUNTER — Encounter: Admit: 2023-03-28 | Payer: PRIVATE HEALTH INSURANCE | Primary: Internal Medicine

## 2023-03-28 ENCOUNTER — Inpatient Hospital Stay: Admit: 2023-03-28 | Payer: PRIVATE HEALTH INSURANCE | Admitting: Transplant Surgery

## 2023-03-28 NOTE — Progress Notes
 MEDICAL RECORDS REQUESTED Electronically Signed by Waverly Ferrari, PCT, March 28, 2023

## 2023-04-11 ENCOUNTER — Telehealth: Admit: 2023-04-11 | Payer: PRIVATE HEALTH INSURANCE | Primary: Internal Medicine

## 2023-04-11 NOTE — Telephone Encounter
 lvm for pt to call back regarding questions about colo [phone]

## 2023-06-22 ENCOUNTER — Encounter: Admit: 2023-06-22 | Payer: PRIVATE HEALTH INSURANCE | Primary: Internal Medicine

## 2023-07-13 ENCOUNTER — Encounter: Admit: 2023-07-13 | Payer: PRIVATE HEALTH INSURANCE | Primary: Internal Medicine

## 2023-07-26 ENCOUNTER — Telehealth: Admit: 2023-07-26 | Payer: PRIVATE HEALTH INSURANCE | Primary: Internal Medicine

## 2023-07-26 NOTE — Telephone Encounter
 I left a Voice mail to remind the patient of the Jacob Luna appt scheduled next Wednesday [phone].

## 2023-07-26 NOTE — Telephone Encounter
 Called remind the patient of the [facility] appt scheduled next Wednesday PT Confirmed.

## 2023-07-27 ENCOUNTER — Telehealth: Admit: 2023-07-27 | Payer: PRIVATE HEALTH INSURANCE | Primary: Internal Medicine

## 2023-07-27 NOTE — Telephone Encounter
 Called remind the patient of the [facility] appt scheduled next Wednesday PT Confirmed.

## 2023-07-31 ENCOUNTER — Encounter: Admit: 2023-07-31 | Payer: PRIVATE HEALTH INSURANCE | Primary: Internal Medicine

## 2023-07-31 DIAGNOSIS — Z79899 Other long term (current) drug therapy: Secondary | ICD-10-CM

## 2023-07-31 DIAGNOSIS — N186 End stage renal disease: Secondary | ICD-10-CM

## 2023-07-31 DIAGNOSIS — R339 Retention of urine, unspecified: Secondary | ICD-10-CM

## 2023-07-31 DIAGNOSIS — Z01818 Encounter for other preprocedural examination: Secondary | ICD-10-CM

## 2023-08-01 ENCOUNTER — Ambulatory Visit: Admit: 2023-08-01 | Payer: BLUE CROSS/BLUE SHIELD | Attending: Internal Medicine | Primary: Internal Medicine

## 2023-08-01 ENCOUNTER — Ambulatory Visit: Admit: 2023-08-01 | Payer: BLUE CROSS/BLUE SHIELD | Attending: Pharmacy | Primary: Internal Medicine

## 2023-08-01 ENCOUNTER — Encounter: Admit: 2023-08-01 | Payer: PRIVATE HEALTH INSURANCE | Primary: Internal Medicine

## 2023-08-01 ENCOUNTER — Ambulatory Visit: Admit: 2023-08-01 | Payer: BLUE CROSS/BLUE SHIELD | Primary: Internal Medicine

## 2023-08-01 ENCOUNTER — Inpatient Hospital Stay: Admit: 2023-08-01 | Discharge: 2023-08-01 | Payer: BLUE CROSS/BLUE SHIELD | Primary: Internal Medicine

## 2023-08-01 ENCOUNTER — Ambulatory Visit: Admit: 2023-08-01 | Payer: BLUE CROSS/BLUE SHIELD | Attending: Transplant Surgery | Primary: Internal Medicine

## 2023-08-01 ENCOUNTER — Other Ambulatory Visit: Admit: 2023-08-01 | Payer: BLUE CROSS/BLUE SHIELD | Primary: Internal Medicine

## 2023-08-01 VITALS — BP 127/76 | HR 61 | Temp 97.40000°F | Ht 70.157 in | Wt 215.2 lb

## 2023-08-01 DIAGNOSIS — N186 End stage renal disease: Secondary | ICD-10-CM

## 2023-08-01 DIAGNOSIS — R9431 Abnormal electrocardiogram [ECG] [EKG]: Secondary | ICD-10-CM

## 2023-08-01 DIAGNOSIS — Z01812 Encounter for preprocedural laboratory examination: Secondary | ICD-10-CM

## 2023-08-01 DIAGNOSIS — E7800 Pure hypercholesterolemia, unspecified: Secondary | ICD-10-CM

## 2023-08-01 DIAGNOSIS — G4730 Sleep apnea, unspecified: Secondary | ICD-10-CM

## 2023-08-01 DIAGNOSIS — Z7984 Long term (current) use of oral hypoglycemic drugs: Secondary | ICD-10-CM

## 2023-08-01 DIAGNOSIS — Z7689 Persons encountering health services in other specified circumstances: Secondary | ICD-10-CM

## 2023-08-01 DIAGNOSIS — Z01810 Encounter for preprocedural cardiovascular examination: Secondary | ICD-10-CM

## 2023-08-01 DIAGNOSIS — I429 Cardiomyopathy, unspecified: Secondary | ICD-10-CM

## 2023-08-01 DIAGNOSIS — Z79899 Other long term (current) drug therapy: Secondary | ICD-10-CM

## 2023-08-01 DIAGNOSIS — Z7682 Awaiting organ transplant status: Secondary | ICD-10-CM

## 2023-08-01 DIAGNOSIS — Z7189 Other specified counseling: Secondary | ICD-10-CM

## 2023-08-01 DIAGNOSIS — N184 Chronic kidney disease, stage 4 (severe): Secondary | ICD-10-CM

## 2023-08-01 DIAGNOSIS — Z01818 Encounter for other preprocedural examination: Secondary | ICD-10-CM

## 2023-08-01 DIAGNOSIS — E782 Mixed hyperlipidemia: Secondary | ICD-10-CM

## 2023-08-01 DIAGNOSIS — I10 Essential (primary) hypertension: Secondary | ICD-10-CM

## 2023-08-01 DIAGNOSIS — I120 Hypertensive chronic kidney disease with stage 5 chronic kidney disease or end stage renal disease: Secondary | ICD-10-CM

## 2023-08-01 DIAGNOSIS — Z9889 Other specified postprocedural states: Secondary | ICD-10-CM

## 2023-08-01 DIAGNOSIS — N189 Chronic kidney disease, unspecified: Secondary | ICD-10-CM

## 2023-08-01 DIAGNOSIS — D649 Anemia, unspecified: Secondary | ICD-10-CM

## 2023-08-01 LAB — COMPREHENSIVE METABOLIC PANEL
BKR A/G RATIO: 1.5 (ref 1.0–2.2)
BKR ALANINE AMINOTRANSFERASE (ALT): 30 U/L (ref 9–59)
BKR ALBUMIN: 4.8 g/dL (ref 3.6–5.1)
BKR ALKALINE PHOSPHATASE: 131 U/L — ABNORMAL HIGH (ref 9–122)
BKR ANION GAP: 16 (ref 7–17)
BKR ASPARTATE AMINOTRANSFERASE (AST): 26 U/L (ref 10–35)
BKR AST/ALT RATIO: 0.9
BKR BILIRUBIN TOTAL: 0.3 mg/dL (ref <=1.2)
BKR BLOOD UREA NITROGEN: 62 mg/dL — ABNORMAL HIGH (ref 8–23)
BKR BUN / CREAT RATIO: 19.7 (ref 8.0–23.0)
BKR CALCIUM: 10 mg/dL (ref 8.8–10.2)
BKR CHLORIDE: 106 mmol/L (ref 98–107)
BKR CO2: 20 mmol/L (ref 20–30)
BKR CREATININE DELTA: -0.12
BKR CREATININE: 3.15 mg/dL — ABNORMAL HIGH (ref 0.40–1.30)
BKR EGFR, CREATININE (CKD-EPI 2021): 21 mL/min/{1.73_m2} — ABNORMAL LOW (ref >=60)
BKR GLOBULIN: 3.2 g/dL (ref 2.0–3.9)
BKR GLUCOSE: 94 mg/dL (ref 70–100)
BKR POTASSIUM: 4.2 mmol/L (ref 3.3–5.3)
BKR PROTEIN TOTAL: 8 g/dL (ref 5.9–8.3)
BKR SODIUM: 142 mmol/L (ref 136–144)

## 2023-08-01 LAB — CBC WITH AUTO DIFFERENTIAL
BKR WAM ABSOLUTE IMMATURE GRANULOCYTES.: 0.03 x 1000/ÂµL (ref 0.00–0.30)
BKR WAM ABSOLUTE LYMPHOCYTE COUNT.: 1.89 x 1000/ÂµL (ref 0.60–3.70)
BKR WAM ABSOLUTE NRBC (2 DEC): 0 x 1000/ÂµL (ref 0.00–1.00)
BKR WAM ANC (ABSOLUTE NEUTROPHIL COUNT): 4.86 x 1000/ÂµL (ref 2.00–7.60)
BKR WAM BASOPHIL ABSOLUTE COUNT.: 0.06 x 1000/ÂµL (ref 0.00–1.00)
BKR WAM BASOPHILS: 0.8 % (ref 0.0–1.4)
BKR WAM EOSINOPHIL ABSOLUTE COUNT.: 0.25 x 1000/ÂµL (ref 0.00–1.00)
BKR WAM EOSINOPHILS: 3.2 % (ref 0.0–5.0)
BKR WAM HEMATOCRIT (2 DEC): 43.5 % (ref 38.50–50.00)
BKR WAM HEMOGLOBIN: 14.2 g/dL (ref 13.2–17.1)
BKR WAM IMMATURE GRANULOCYTES: 0.4 % (ref 0.0–1.0)
BKR WAM LYMPHOCYTES: 24.5 % (ref 17.0–50.0)
BKR WAM MCH (PG): 31.8 pg (ref 27.0–33.0)
BKR WAM MCHC: 32.6 g/dL (ref 31.0–36.0)
BKR WAM MCV: 97.3 fL (ref 80.0–100.0)
BKR WAM MONOCYTE ABSOLUTE COUNT.: 0.61 x 1000/ÂµL (ref 0.00–1.00)
BKR WAM MONOCYTES: 7.9 % (ref 4.0–12.0)
BKR WAM MPV: 11.4 fL (ref 8.0–12.0)
BKR WAM NEUTROPHILS: 63.2 % (ref 39.0–72.0)
BKR WAM NUCLEATED RED BLOOD CELLS: 0 % (ref 0.0–1.0)
BKR WAM PLATELETS: 226 x1000/ÂµL (ref 150–420)
BKR WAM RDW-CV: 13.3 % (ref 11.0–15.0)
BKR WAM RED BLOOD CELL COUNT.: 4.47 M/ÂµL (ref 4.00–6.00)
BKR WAM WHITE BLOOD CELL COUNT: 7.7 x1000/ÂµL (ref 4.0–11.0)

## 2023-08-01 LAB — CYTOMEGALOVIRUS ANTIBODY, IGG
BKR CMV IGG INITIAL RESULT: <0.2 U/mL
BKR CYTOMEGALOVIRUS IGG ANTIBODY: NEGATIVE

## 2023-08-01 LAB — PT/INR AND PTT (BH GH L LMW YH)
BKR INR: 0.98 (ref 0.86–1.13)
BKR PARTIAL THROMBOPLASTIN TIME: 29.7 s (ref 23.0–31.0)
BKR PROTHROMBIN TIME: 10.8 s (ref 9.6–12.3)

## 2023-08-01 LAB — LIPID PANEL
BKR CHOLESTEROL/HDL RATIO: 2.7 (ref 0.0–5.0)
BKR CHOLESTEROL: 109 mg/dL
BKR HDL CHOLESTEROL: 40 mg/dL (ref >=40)
BKR LDL CHOLESTEROL SAMPSON CALCULATED: 50 mg/dL
BKR TRIGLYCERIDES: 102 mg/dL

## 2023-08-01 LAB — EBNA-1 ANTIBODY, IGG     (BH GH LMW YH)
BKR EBNA-1 ANTIBODY, IGG: POSITIVE
BKR EBV EBNA INITIAL RESULT: >600 U/mL

## 2023-08-01 LAB — TREPONEMA PALLIDUM (SYPHILIS) ANTIBODY W/REFLEX
BKR TREPONEMA PALLIDUM ANTIBODY INITIAL RESULT: <0.1 {index}
BKR TREPONEMA PALLIDUM ANTIBODY TOTAL, SERUM: NONREACTIVE

## 2023-08-01 LAB — EBV SEROLOGY INTERPRETATION     (BH GH LMW YH)

## 2023-08-01 LAB — BILIRUBIN, DIRECT: BKR BILIRUBIN DIRECT: 0.1 mg/dL (ref <=0.2)

## 2023-08-01 LAB — MEASLES (RUBEOLA) ANTIBODY, IGG
BKR MEASLES IGG INITIAL RESULT: >300 [AU]/ml
BKR MEASLES IGG: POSITIVE

## 2023-08-01 LAB — HEPATITIS C AB WITH REFLEX TO HCV PCR: BKR HEPATITIS C ANTIBODY: NEGATIVE

## 2023-08-01 LAB — HEPATITIS A ANTIBODY, IGG     (YH): BKR HEPATITIS A IGG ANTIBODY: POSITIVE

## 2023-08-01 LAB — TOXOPLASMA GONDII ANTIBODY, IGG
BKR TOXO GONDII IGG: NEGATIVE
BKR TOXO IGG INITIAL VALUE: <3 [IU]/mL

## 2023-08-01 LAB — VARICELLA ZOSTER ANTIBODY, IGG
BKR VARICELLA-ZOSTER ANTIBODY, IGG: POSITIVE
BKR VZV IGG INITIAL RESULT: 27.1 {s_co_ratio}

## 2023-08-01 LAB — HEPATITIS C VIRUS BY RT-PCR, QUANTITATIVE     (BH GH L LMW YH): BKR HEPATITIS C VIRUS, QUANT. PCR, SERUM: NOT DETECTED

## 2023-08-01 LAB — HIV-1/HIV-2 ANTIBODY/ANTIGEN SCREEN W/REFLEX     (BH GH LMW YH): BKR HIV 1 AND 2 ANTIBODY/HIV-1 ANTIGEN SCREEN: NEGATIVE

## 2023-08-01 LAB — EPSTEIN-BARR VCA ANTIBODY, IGG     (GH LMW YH)
BKR EPSTEIN-BARR VCA IGG: POSITIVE
BKR VCA IGG AB SCREEN INITIAL RESULT: >750 U/mL

## 2023-08-01 MED ORDER — LEVOTHYROXINE 125 MCG TABLET
125 | Freq: Every day | ORAL | Status: AC
Start: 2023-08-01 — End: ?

## 2023-08-01 MED ORDER — FERROUS SULFATE 325 MG (65 MG IRON) TABLET
32565 | Freq: Every day | ORAL | Status: AC
Start: 2023-08-01 — End: ?

## 2023-08-01 MED ORDER — ACETAMINOPHEN 325 MG TABLET
325 | Freq: Four times a day (QID) | ORAL | Status: AC | PRN
Start: 2023-08-01 — End: ?

## 2023-08-01 NOTE — Other
 TRANSPLANT PHARMACY:  KIDNEY PRE-LISTING CLINIC EVALUATIONAlbert Luna is a 63 y.o. male being evaluated for potential listing for kidney transplant. 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 I emphasized the importance of medication adherence to avoid rejection and improve graft outcomes. I reviewed specific medications that he will be expected to take as part of his transplant regimen, including how to take each medication, common side effects, how we will monitor for side effects, and probable duration of therapy for each. I stressed the importance of avoiding herbal medications as well as grapefruit juice and recommended always checking with his doctor or pharmacist post-transplant before starting any new medication due to the potential drug interactions.  I also met one-on-one with Jacob Luna to review his pre-transplant medications and address concerns he had about his current medications. I updated his allergies, immunizations, and medication list.  Immunization History Administered Date(s) Administered  COVID-19 Vaccine - PFIZER 10/16/2019, 11/06/2019, 06/06/2020  COVID-19, Moderna, 21yr + up, 50 mcg/0.5 mL 06/09/2022  COVID-19, PFIZER 12Y up,mRNA,TRIS 11/24/2020  COVID-19, PFIZER Bivalent, 12 Yrs+, 0.48mL 07/02/2021 Patient was counseled on importance of routine vaccination, including hepatitis B.  08/01/2023  10:00 AM [place] Medication Adherence Scale (MMAS) Do you sometimes forget to take your medicine? No People sometimes miss taking their medicines for reasons other than forgetting.  Thinking over the past 2 weeks, were there any days when you did not take your medication? No Have you ever cut back or stopped taking your medicine without telling your health care provider because you felt worse when you took it?  Yes Have you ever cut back or stopped taking your medicine without telling your health care provider because you felt worse when you took it?  - Comments >1 year ago When you travel or leave home, do you sometimes forget to bring along your medicine? No Did you take all of your medicines yesterday?  Yes When you feel your symptoms are under control, do you sometimes stop taking your medicine?  No Taking medicine every day is a real inconvenience for some people.  Do you ever feel hassled about sticking to your treatment plan? No How often do you have difficulty remembering to take all of your medicine? Never/rarely [place] Medication Adherence Score 7 MMAS Score Interpretation Medium Adherence  	Based on answers to survey, minimal concern for intentional or unintentional non-adherence.  Patient reports strict adherence over the past year with his prescribed medications, but has reported in the past he has cut back on medications without discussing with the medical team first (>1 year ago). We discussed the importance of informing his transplant team regarding any medication issues before making changes to his regimen due to risk of complications.Patient was also asked the following questions and provided these responses:  08/01/2023 MMAS New Pt. Clinic Questions Who manages your medications? Self How do you organize your medications? Pill box How do you remember to take your medications now? Routine Do you use chronic pain medication(s) for pain or anxiety? No Do you use marijuana currently? No Do you have a history of diabetes or high blood sugar? No Are you currently on an anticoagulant or blood thinner? No Are you currently on antiplatelet therapy? No Are you currently on an hormonal contraceptive or hormone replacement therapy? No Are you currently on any immunosuppressive (transplant or non-transplant) medications? No Have you ever been on a previous immunosuppressive medication? Yes What immunosuppressive medication(s)  have you previously taken? Prednisone What immunosuppressive medication(s) have you previously taken? - Comments Was on prednisone ~1 year, been off for about 3 years Have you had bariatric surgery including gastric sleeve or gastric bypass? No   Jacob Luna does not have a history of transplant. Medical Review Board Summary Table MMAS-8 Score    08/01/2023  10:00 AM [place] Medication Adherence Scale (MMAS) Score Only [place] Medication Adherence Score 7 MMAS Score Interpretation Medium Adherence   Most recent HgbA1c No results found for: HGBA1C Number of anti-hypertensive agents  3 (amlodipine, lisinopril, HCTZ) Notable medication history immunosuppression - hx of long-term prednisone usage (~1 year) about 3-4 years ago for Gout Recommendations for modification of initial immunosuppression post-transplant N/A Hepatitis B serologies to be reviewed at time of listing. Jacob Luna is cleared for transplant listing from a pharmacy perspective.[address] PharmDJanuary 29, 202511:37 AM

## 2023-08-01 NOTE — Progress Notes
 Transplant Nutrition ScreenFrost, Brelan	63 y.o.	[id number]	Height: Ht Readings from Last 1 Encounters: 08/01/23 5' 10.16 (1.782 m) Weight: Wt Readings from Last 1 Encounters: 08/01/23 97.6 kg BMI: BMI Readings from Last 1 Encounters: 08/01/23 30.74 kg/m? Nutrition screening tool completed via paper form. Reviewed nutrition screening for Tresten Pantoja who also participated in a 10 minute education video regarding nutrition and transplant. Patient reports a stable weight with no change in appetite and no nausea/vomiting. Patient denies trouble chewing/swallowing, muscle loss, taste changes, bowel motility concerns. Patient appears nutritionally stable at this time.Follow-Up-Provided written materials on the importance of nutrition and transplant and encouraged patient to review.  -Provided a list of resources to utilize when planning their diet and encouraged patient to review.  -Encouraged patient to find a local dietitian in their area should they require more assistance with meal planning.  -Provided them with this writer's contact information and encouraged them to call with any questions.  Elouise Munroe, MS, RD

## 2023-08-01 NOTE — Progress Notes
 Nursing Assessment: [name] is a 63 y.o. male with ESRD secondary to HTN. he has not been maintained on dialysis, and presents today for initial transplant evaluation. he was referred to the Transplant Clinic by  [facility]. Karnofsky: 90% - Able to Carry on Normal Activity: Minor Symptoms of DiseasePhysical Capacity: No LimitationsPatient is willing to accept blood transfusion: yesProblem List: Patient Active Problem List  Diagnosis Date Noted  Sleep apnea 01/01/2022  Mixed hyperlipidemia 01/01/2022  Abnormal ECG 01/01/2022  Cardiomyopathy (HC Code) 10/09/2019  Essential hypertension 10/09/2019  Preoperative cardiovascular examination 07/15/2019  Stage 4 chronic kidney disease (HC Code) (HC CODE) (HC Code) 03/06/2019  Results: Vitals: Wt Readings from Last 1 Encounters: 08/01/23 97.6 kg  Ht Readings from Last 1 Encounters: 08/01/23 5' 10.16 (1.782 m)  There is no height or weight on file to calculate BMI. BP Readings from Last 1 Encounters: 08/01/23 127/76  Pulse Readings from Last 1 Encounters: 08/01/23 61  Current Medications: Current Outpatient Medications:   acetaminophen (TYLENOL) 325 mg tablet, Take 2 tablets (650 mg total) by mouth every 6 (six) hours as needed for pain., Disp: , Rfl:   allopurinoL (ZYLOPRIM) 100 mg tablet, Take 1 tablet (100 mg total) by mouth daily., Disp: 90 tablet, Rfl: 3  amLODIPine (NORVASC) 5 mg tablet, Take 1 tablet (5 mg total) by mouth daily., Disp: , Rfl:   empagliflozin (JARDIANCE) 10 mg tablet, Take 1 tablet (10 mg total) by mouth daily., Disp: 90 tablet, Rfl: 3  ferrous sulfate (FEOSOL) 325 mg (65 mg iron) tablet, Take 1 tablet (325 mg total) by mouth daily., Disp: , Rfl:   levothyroxine (SYNTHROID, LEVOTHROID) 125 MCG tablet, Take 1 tablet (125 mcg total) by mouth daily., Disp: , Rfl:   lisinopriL-hydrochlorothiazide (PRINZIDE,ZESTORETIC) 20-25 mg per tablet, Take 1 tablet by mouth daily., Disp: , Rfl:   rosuvastatin (CRESTOR) 40 mg tablet, Take 1 tablet (40 mg total) by mouth daily., Disp: , Rfl:   zolpidem (AMBIEN) 5 mg tablet, Take 1 tablet (5 mg total) by mouth nightly as needed (insomnia)., Disp: , Rfl: Nursing Transplant Education: Barriers to learning:none identifiedTopics discussed at this visit: UNOS Information and Education Manual ProvidedTransplant Multidisciplinary TeamEvaluation ProcessEvaluation TestingCompletion of Evaluation for TransplantYNHTC Zero Tolerance Policy for Alcohol and Drug UseKidney Allocation SystemKDPI >85Monthly Red Top Required for Active ListingSurgical ProcedureAlternative TreatmentsPotential Medical/Psychosocial RisksMultiple Listing and Wait List TransferHCC ProtocolLive Donor Options and ProcessLive Donor HIPAA RequirementsPHS Risk CriteriaOrgan Donor Risk Factors - FunctionOrgan Donor Risk Factors - Disease TransmissionDeceased Donor Categories (DCD/DBD)Assessment of the DonorRight to Refuse TransplantScientific Registry of Transplant Recipient (SRTR) Outcomes - data provided with consent for evaluationMedicare ApprovalTransplantation by a Center which is not approved by [employer] and Transplantation Network [employer]) Patient service line [phone] Hour Transplant Call Center Contact Information ([phone])Surgical ComplicationsImmunosuppression Risks/BenefitsImmunosuppression AdjustmentWound CareHealth MaintenanceAnnual Standard of Care TestingNeed for Periodic Testing as Indicated by the Transplant TeamNursing Plan: The patient has completed an initial transplant education class with the multidisciplinary team.The patient has completed the Consent for Kidney Transplant Evaluation, including a review of our current SRTR data. The patient has been informed that they may stop the evaluation process at any time.The patient has been informed that each transplant program is required to have both inclusion and exclusion criteria for kidney transplantation.  Programmatic inclusion and exclusion criteria has been discussed during the education session, patients have been informed that upon request - a copy of the Inclusion and Exclusion criteria will be provided to them. The patient has met with a transplant clinician  and had the opportunity to review the multiple listing policy as well as complete deceased donor acceptance criteria. The patient has been informed that their case will be presented to the transplant Medical Review Board once all diagnostic testing and assessments are completed.Required testing in order to complete initial evaluation has been reviewed with the patient. The patient will be required to complete the following testing prior to Medical Review Board Committee and understands that additional testing may be needed as other test results are received. Required Testing: Per guidelines and protocols, the patient will require the following:INCOMPLETE KIDNEY EVALUATION TASKS Due Form 2728 Obtained 08/01/2023 Obtain Medical Records  Kidney Evaluation Labs  The following SRTR data was provided to the patient during their visit today: Adult Kidney Transplant ProgramScientific Registry for Transplant Recipients [facility]) Data**www.srtr.orgTransplant OutcomesFOR TRANSPLANTS PERFORMED BETWEEN January 01, 2020 TO DECEMBER 31, 2023Release date JANUARY 7, 2025SRTR performs assessments of transplant outcomes every 6 months by looking at the outcomes of patients who underwent transplant over 2.5 years. The table below shows assessments of the data from the transplant program at [facility] vs. the [place]. Likelihood that your kidney transplant is successful at 1-year (ADULT) ALL DONORS EVALUATED FROM LIVING DONORS FROM DECEASED DONORS Percentage Alive with a functioning transplant at 1-year - [place](Deaths and Re-transplants are consideredgraft failures) 94.98% 100% 92.83% Percentage Alive with a functioning transplant at 1-year - [place](Deaths and Re-transplants are consideredgraft failures) 94.97% 97.91% 94.02%     Likelihood of living at 1-year after kidney transplant  (ADULT) ALL DONORS EVALUATED FROM LIVING DONORS FROM DECEASED DONORS 1-year Patient Survival [facility]                                                  (Re-transplants excluded) 97.93% 100% 97.08% 1-year Patient Survival [place] (Re-transplants excluded)                                               97.14% 98.83% 96.59%  Transplant Candidate Registration (TCR) Source Data for TIEDI Recipient Center: CTYNOrgan: Kidney Below data has been reviewed and confirmed with transplant candidate. Ethnicity: Black, not of Hispanic originCitizenship: USUS citizen:  Astronomer to the [place]: born in the [place] Education Level:Associate/Bachelor DegreeKarnofsky Performance Scale:90% - Able to [employer] on Normal Activity: Minor Symptoms of DiseasePhysical capacity: No LimitationsWorking For income: NoPrevious Transplant: NoPrevious Pancreas Islet Infusion: NoWilling to accept Blood Transfusion: YesSource of Payment:Private InsuranceClinical Information: At ListingEstimated body mass index is 30.74 kg/m? as calculated from the following:  Height as of an earlier encounter on 08/01/23: 5' 10.16 (1.782 m).  Weight as of an earlier encounter on 08/01/23: 97.6 kg.Primary Diagnosis: Hypertensive nephrosclerosisDiabetes: NoSymptomatic Peripheral Vascular Disease: NoAny Previous Malignancy:  NoAlbumin will be based upon laboratory results. Exhausted Vascular Access: NoExhausted Peritoneal Access: NoElectronically Signed by Carleene Cooper, RN, August 01, 2023 Electronically Signed by Carleene Cooper, RN, August 01, 2023

## 2023-08-01 NOTE — Progress Notes
 SOCIAL WORK ASSESSMENTPatient Name: Jacob Luna Record Number: [id number] Date of Birth: 1962-03-17Medical Social Work Assessment Adult  Flowsheet Row Most Recent Value Admission Information  Document Type Clinical Assessment - Able to Assess Reason for Current Social Work Involvement Support/Coping, Non-Consult Assessment/Note Source of Information Patient Record Reviewed Yes Level of Care Ambulatory What medium(s) of communication were used with patient/family/caregiver? Face-to-Face / In-Person Relationships  Marital Status Married Lives With Spouse Family circumstances Jacob Luna reports he lives with his wife of 32 years Informal Supports (family, friends, church, etc) his mother, his wife, his brother and his son Abuse Screen (yes response referral indicated)  Able to respond to abuse questions Yes Is there anyone in your life that is hurting or threatening you in anyway? no Safety Plan/Comments No concerns reported Language needed None, Patient Speaks English Literacy Read/write independently Special Needs No concerns reported Social Determinants of Health  Financial Concerns None What is your living situation today? I have a steady place to live Think about the place you live. Do you have problems with any of the following? None In the past 12 months has the electric, gas, oil, or water company threatened to shut off services in your home? No Within the past 12 months, you worried that your food would run out before you got the money to buy more. Never true Within the past 12 months, the food you bought just didn't last and you didn't have money to get more. Never true In the past 12 months, has lack of transportation kept you from medical appointments or from getting medications? No In the past 12 months, has lack of transportation kept you from meetings, work, or from getting things needed for daily living? No Mental Status Mental Status Able to Assess Appearance appears stated age Attitude/Demeanor/Rapport cooperative, pleasant Affect (typically observed) accepting, pleasant, hopeful, calm Orientation no deficits recognized Insight Good Health Insight/Judgment no deficits recognized Reaction to Event/Health Status Accepting, Adjusting, Hopeful Suicide Risk Assessment  Reason for Assessment Utilizing SAFE-T and C-SSRS (Check all that apply) Social Work Consult/Assessment C-SSRS Able to Assess Screening for suicidal ideation within Past Month Have you wished you were dead or wished you could go to sleep and not wake up? no Have you actually had any thoughts of killing yourself? no Have you ever done anything, started to do anything, or prepared to do anything to end your life? no [place] Suicide Risk Level low risk Specific Questions about Thoughts, Plans, Suicidal Intent (SAFE-T) Negative responses above do not indicate a need for SAFE-T assessment Risk Assessment  Access to Firearms? No Concern for patient utilizing another lethal method of self-harm, suicide, or harm to others No Risk Assessment Able to Assess Risk to Self Able to Assess Risk to Self - Self-Injurious Behavior None identified Attitudes regarding Self-Injury None disclosed Imminent Risk for Self-Injury in Community Low Imminent Risk for Self-Injury in Facility Low Risk to Others Able to Assess Risk to Others None Disclosed Attitude regarding Aggression / Violence None Disclosed Imminent Risk for Violence in Community Low Imminent Risk for Violence in Facility Low Current and Past Psychiatric Diagnoses Able to Assess Mood Disorder No Anxiety Disorder No Psychotic Disorder No Substance Use Disorder No Post-Traumatic Stress Disorder (PTSD) No Attention Deficit/Hyperactivity Disorder (ADHD) No Traumatic Brain Injury (TBI) No Cluster B Personality Disorders or Traits (i.e. Borderline, Antisocial, Histrionic & Narcissistic) No Conduct Problems (Antisocial Behavior, Aggression, Impulsivity) No Suicide Attempt No Prior Attempts Presenting Symptoms None Family History None reported Precipitants/Stressors Chronic physical pain/Other acute medical,  Stressful Life Events Change in Mental Health or Substance Use Disorder Treatment Not applicable General Risk Factors Recent stress General Protective Factors receptive to change, able to engage others, cooperative with interview, positive coping skills, able to express feelings, positive social supports committed and able to help, motivated for treatment, engaged in work or school This patient was screened using the [place] Suicide Severity Rating Scale (CSSRS)  Yes I conducted a suicide risk assessment including a suicide inquiry and assessment of risk and protective factors, as recommended by the standard Suicide Assessment Five-Step Evaluation and Triage (SAFE-T) for Mental Health Professionals. No, the C-SSRS did not produce a positive screen Cause for concern None Based on my assessment, the level of risk for this patient to suicide in an inpatient or emergency setting is:  MINIMAL because the patient does not present with suicidal ideation, does not have a history of suicide attempts, and the balance of protective factors outweighs any current risk factors Based on my assessment, the level of risk for this patient to suicide in the community is:  MINIMAL Recommended Next Steps Remain in/Return to Community Remain in/Return to [employer] Patient reports, Protective factors (see above) Patient reports No current ideation of suicide Substance Use  Active substance use Reports none Coping  Reaction to Event/Health Status Accepting, Adjusting, Hopeful Transplant  Transplant Status Pre-transplant Organ Kidney Patient is the  Recipient [facility]ses [facility] No Primary Insurance Commercial insurance Adequate prescription coverage Yes Patient is on dialysis No Patient/Caregiver understanding of risks and benefits of transplantion Good Patient's report of adherence to medical regimen Fully adherent and develops effective partnership with medical staff Patient concerns toward transplantation No concerns identified Caregiver concerns toward transplantation No concerns identified Patient's Coping Abilities Successful coping history Patient's Coping Strategies Diversional activities/hobbies, Support network Transplant psychosocial education provided to patient/family Animal contact and pet safety, Importance of support system, Importance of reliable transportation, Potential psychosocial risk (depression, PTSD, generalized anxiety, anxiety regarding dependence on others, and feelings of guilt), Substance use as it relates to transplant candidacy, Frequency/importance of clinic visits and lab work, Hospitalization and recovery process, Importance of medical/medication adherence, Financial/Insurance implications Support System No concerns Noted Education - Adult completed college Formulation: Recommendation(s) and Intervention(s) (including for discharge to occur)  Psychosocial issues requiring intervention Jacob Luna is pursuing kidney transplant listing at [facility]. Jacob Luna is not currently on dialysis. Jacob Luna was unaccompanied at time of evaluation however, his wife was on facetime during encounter. Mrs. Shifflet is identified as his primary support post-transplant. Mrs. Howatt confirmed her willingness to provide post-transplant support. My assessment did not reveal any obvious psychosocial issues/barriers to transplant listing. Psychosocial interventions 60 minutes spent in direct contact with Jacob Luna. I met with Jacob Luna to perform the required pre-kidney transplant psychosocial assessment to screen for risk factors that may complicate post-transplant experience. Jacob Luna presents with a good understanding of the short and long-term medical and psychosocial risks related to transplant. Jacob Luna was very pleasant and engaged easily in discussion.     PSYCHO-EDUCATION: Based on the information learned today, Jacob Luna demonstrates a good understanding of transplant and appears realistic about potential outcomes/risks. Transplant related psychoeducation provided, including importance of global adherence, maintaining adequate support network of caregivers, anticipated care needs, financial implications, and insurance coverage considerations in the post-transplant period.   LIVING SITUATION: Jacob Luna reports he resides with his wife of 32 years in an apartment in [place] [place].  SUPPORT NETWORK/TRANSPORTATION: Jacob Luna identifies his mother, his wife, his brother and his son as his support network. This support includes transportation to post-transplant clinic appointments.   MENTAL HEALTH: Jacob Luna reports no current or prior mental health diagnosis. Jacob Luna reports spending time with his wife, working out and music helps him cope despite medical concerns.   SUBSTANCE USE: Jacob Luna reports no history or current use of marijuana, nicotine or other illicit substance use. Jacob Luna reports drinking beer socially in the past, last drink was in 2015.  FINANCES/INSURANCE: I reviewed with Jacob Luna that he receives Social Security Income and does not report any financial concerns related to a transplant hospitalization and recovery period. Jacob Luna is insured by [facility] and reports access to medications and other healthcare needs. Collaborations ADHERENCE: I have reviewed the collateral information available in Mr. Lefferts medical record and do not find any obvious psychosocial concerns. Mr. Fidalgo reports he is able to manage daily medications and medical appointments at this time and feels he will be able to adhere to the complex regimen associated with transplant.       Based on current level of medical adherence, previous transplant and understanding of medical issues I feel Mr. Cowin is capable of following post-transplant responsibilities.       Mr. Gomes reports no advanced directive documentation completed and does not wish to identify surrogate decision maker at this time.       I completed the [employer] Integrated Psychosocial Assessment for Transplant (SIPAT) tool.      08/01/2023   3:36 PM [employer] Integrated Psychosocial Assessment for Transplant (SIPAT) SIPAT Score 13 SIPAT Score Interpretation Good candidate  A note to patients: This Social Work note is a summary of our assessment and recommendations. For reasons of privacy and briefness, this note does not attempt to cover all topics discussed during the visit. The [employer] Integrated Psychosocial Assessment for Transplant (SIPAT) is a screening tool used to assist in the Social Work assessment of all organ transplant candidates. The outcome of the SIPAT is only one piece of information used when the team is considering your transplant candidacy. If you have any questions or concerns about this note, please discuss it with me via telephone. Case will be discussed at the Medical Review Board when presented. Handoff Required? No Next Steps/Plan (including hand-off): Based on my assessment, review of available collateral information, and outcome of the SIPAT tool, Mr. Marcella is a good candidate, and I support listing.     No further social work intervention indicated. I will remain available as needed to the patient, family, and transplant team for ongoing transplant related psychosocial concerns. Signature: Alger Memos, LMSWKidney Transplant social [phone] Contact Information: [phone]

## 2023-08-01 NOTE — Progress Notes
 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 He is not currently on dialysis. He denies urological issues and currently makes large amounts of urine. He denies lower extremity vascular issues. He denies any personal or family history of difficulties with anesthesia. He denies any personal or family history of bleeding or clotting disorders.Jacob Luna reports he had bilateral inguinal hernia repairs in 1979.Currently, Jacob Luna GFR is 21, however, looking back into his chart you can see a qualifying GFR of 20 from October of 2021 and another GFR of 19 from December of 2021. Jacob Luna does not have potential living donors at this time.Jacob Luna currently does not work. 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 has wife as support system.Medical History: PMH PSH Past Medical History: Diagnosis Date  Anemia   per H/P by Dr Ephraim Hamburger  Chronic renal insufficiency   PER H/P by Dr Ephraim Hamburger  Hypercholesteremia   Hypertension   Past Surgical History: Procedure Laterality Date  ACHILLES TENDON REPAIR Left   HAND SURGERY Left   hardware placed  HERNIA REPAIR Bilateral 1979  Inguinal  NASAL SINUS SURGERY    REPAIR PATELLAR TENDON Left   Social History Family History Social History Socioeconomic History  Marital status: Married   Spouse name: Not on file  Number of children: Not on file  Years of education: Not on file  Highest education level: Not on file Occupational History  Not on file Tobacco Use  Smoking status: Never  Smokeless tobacco: Never Vaping Use  Vaping status: Never Used Substance and Sexual Activity  Alcohol use: Not Currently Drug use: Never  Sexual activity: Not on file Other Topics Concern  Not on file Social History Narrative  Not on file Social Drivers of Health Financial Resource Strain: Not on file Food Insecurity: Not on file Transportation Needs: Not on file Physical Activity: Not on file Stress: Not on file Social Connections: Not on file Intimate Partner Violence: Not on file Housing Stability: Not on file  Family History Problem Relation Age of Onset  Heart failure Father   Kidney disease Father       was on RRT  Hypertension Father    Medications Current Outpatient Medications Medication Sig Dispense Refill  allopurinoL (ZYLOPRIM) 100 mg tablet Take 1 tablet (100 mg total) by mouth daily. 90 tablet 3  amLODIPine (NORVASC) 5 mg tablet Take 1 tablet (5 mg total) by mouth daily.    empagliflozin (JARDIANCE) 10 mg tablet Take 1 tablet (10 mg total) by mouth daily. 90 tablet 3  levothyroxine (SYNTHROID, LEVOTHROID) 150 mcg tablet 100 mcg daily.    lisinopriL-hydrochlorothiazide (PRINZIDE,ZESTORETIC) 20-25 mg per tablet 1 tablet    rosuvastatin (CRESTOR) 40 mg tablet     zolpidem tartrate (ZOLPIDEM ORAL) Take by mouth as needed.   No current facility-administered medications for this visit.   Allergies No Known Allergies Review of Systems: General:  Negative for recent hospitalizations or compliants.Head and Neck:  NegativeCardiac:  Negative for chest painPulmonary:  Negative for shortness of breathGI:  Negative for refluxUrologic:  Negative for dysuria or retentionMusculoskeletal:  Negative for joint painSkin:  Negative for new rashs or lesionsPsychiatric: Negative depression or anxietyInfectious Disease:  Negative for chronic UTI'sObjective: BP 127/76 (Site: r a, Position: Sitting, Cuff Size: Medium)  - Pulse 61  - Temp 97.4 ?F (36.3 ?  C) (Temporal)  - Ht 5' 10.16 (1.782 m)  - Wt 97.6 kg  - SpO2 99%  - BMI 30.74 kg/m? Physical Exam: General:  Well developed and well nourished and in no apparent distress.  Neuro:  Nonfocal without lateralizing signs, CN appear intact.HEENT:  Normocephalic and atraumatic. Sclera anicteric.  Mucous membranes moist.  Oral cavity free of disease.CV:  Regular rate. Warm and well perfused.Chest:  No increased work of breathing, wheezing or stridor.Abdomen: Soft, nontender and nondistended.  No surgical scars.Extremities: Moves all extremities.  No lower extremity edema.  Left femoral pulse present 2+. Right femoral pulse present 2+.Skin:  Warm and dry without rashes.Psych:  Appropriate affect, capable of informed consent.Assessment/Plan: Patient presents for kidney transplant candidacy. Based on my review of the patient's medical records, detailed interview with the patient here today in clinic, and a thorough physical examination, Jacob Luna, is probably an acceptable kidney transplant candidate.Additional Testing Needed:[place] Abdomen and PelvisEcho - updateGFR 20 from October 2021 / GFR 19 from December 2021Colonoscopy - need results - per patient was done 4 years ago with 10 year recallI had an extensive discussion with the patient regarding the risks and benefits of kidney transplantation and immunosuppression. Specifically, discussed issues included, but was not limited to: bleeding, infection, graft longevity, graft thrombosis, rejection, urine leak, urine stricture, hernia and side-affects associated with immunosuppression medications. Multiple listing notification was performed. We discussed the different types of deceased donor grafts and specifically discussed extended donor, donor after cardiac death, hepatitis B core antibody positive grafts and hepatitis C positive grafts. Educational materials, including the UNOS 24 hour patient services help-line was given to the patient. We discussed PHS high risk designation and the required follow-up testing and consenting practices. Finally, we discussed living donor options inclusive of kidney exchange option. My surgical assessment of this patients co-morbid conditions places this patients at a medium risk of surgical complications in the perioperative period.The patient may require cardiology clearance, depending on results from cardiac testing.I spent 45 minutes in direct contact with the patient, of which at least half was spent in counseling and coordination of care. Signed:Sheridan Hew A Analyah Mcconnon, APRNDepartment of Transplant Surgery1/29/2025Lab Results Component Value Date  WBC 5.8 11/16/2022  HGB 13.8 11/16/2022  HCT 39.7 11/16/2022  MCV 90.8 11/16/2022  PLT 213 11/16/2022 No results found for: ABORHLab Results Component Value Date  CREATININE 3.27 (H) 03/09/2023  BUN 73 (H) 03/09/2023  NA 137 03/09/2023  K 5.4 (H) 03/09/2023  CL 106 03/09/2023  CO2 22 03/09/2023 .

## 2023-08-02 ENCOUNTER — Inpatient Hospital Stay: Admit: 2023-08-02 | Discharge: 2023-08-02 | Payer: BLUE CROSS/BLUE SHIELD | Primary: Internal Medicine

## 2023-08-02 LAB — HEMOGLOBIN A1C
BKR ESTIMATED AVERAGE GLUCOSE: 120 mg/dL
BKR HEMOGLOBIN A1C: 5.8 % — ABNORMAL HIGH (ref 4.0–5.6)

## 2023-08-02 LAB — QUANTIFERON-TB
BKR QUANTIFERON-TB GOLD IN-TUBE: NEGATIVE
BKR QUANTIFERON-TB MITOGEN MINUS NIL: 10 [IU]/mL
BKR QUANTIFERON-TB NIL: 0 [IU]/mL
BKR QUANTIFERON-TB1 MINUS NIL: 0.01 [IU]/mL (ref <0.35)
BKR QUANTIFERON-TB2 MINUS NIL: 0.03 [IU]/mL (ref <0.35)

## 2023-08-02 LAB — PTH, INTACT WITHOUT CALCIUM: BKR PARATHYROID HORMONE INTACT: 55.6 pg/mL (ref 15.0–65.0)

## 2023-08-04 LAB — ARUP MISCELLANEOUS TEST, REFRIGERATED (BH GH LMW YH)

## 2023-08-06 LAB — DRUG SCREEN 9 PANEL, SERUM/PLASMA W/REFLEX (YH)
AMPHETAMINES (Z5249): NOT DETECTED ng/mL
BARBITURATES (Z5250): NOT DETECTED ug/mL
BENZODIAZEPINES (Z5247): NOT DETECTED ng/mL
CANNABINOIDS (Z5248): NOT DETECTED ng/mL
COCAINE/METABOLITES (Z5246): NOT DETECTED ng/mL
FENTANYL/ACETYL FENTANYL (Z6248): NOT DETECTED ng/mL
METHADONE/METABOLITE (Z5251): NOT DETECTED ng/mL
METHAMPHETAMINE/MDMA (Z5253): NOT DETECTED ng/mL
OPIATES (Z5245): NOT DETECTED ng/mL
OXYCODONE/OXYMORPHONE (Z5254): NOT DETECTED ng/mL
PHENCYCLIDINE (Z5252): NOT DETECTED ng/mL

## 2023-08-07 LAB — HLA PRE TRANSPLANT ANTIBODY EVALUATION (YMG)
HLA CLASS I ANTIBODY SPECIFICITY: NEGATIVE
HLA CLASS I ANTIBODY, TEST RESULT (ID): NEGATIVE
HLA CLASS II ANTIBODY SPECIFICITY: NEGATIVE
HLA CLASS II ANTIBODY, PERCENT POSITIVE (ID): 0
HLA CLASS II ANTIBODY, TEST RESULT (ID): NEGATIVE

## 2023-08-08 LAB — HLA TYPE, DNA STORAGE (YMG)
HLA CLASS I ANTIGEN MOLECULAR BW-1: 4
HLA CLASS I ANTIGEN MOLECULAR BW-2: 4
HLA CLASS I ANTIGEN/SEROLOGIC EQUIVALENT  A-1: 24
HLA CLASS I ANTIGEN/SEROLOGIC EQUIVALENT  A-2: 66
HLA CLASS I ANTIGEN/SEROLOGIC EQUIVALENT  B-1: 57
HLA CLASS I ANTIGEN/SEROLOGIC EQUIVALENT  B-2: 58
HLA CLASS I ANTIGEN/SEROLOGIC EQUIVALENT  BW-1: 4
HLA CLASS I ANTIGEN/SEROLOGIC EQUIVALENT  BW-2: 4
HLA CLASS I ANTIGEN/SEROLOGIC EQUIVALENT  CW-1: 6
HLA CLASS I ANTIGEN/SEROLOGIC EQUIVALENT  CW-2: 7
HLA CLASS II ANTIGEN/SEROLOGIC EQUIVALENT DQB1-1: 9
HLA CLASS II ANTIGEN/SEROLOGIC EQUIVALENT DQB1-2: 6
HLA CLASS II ANTIGEN/SEROLOGIC EQUIVALENT DRB1-1: 13
HLA CLASS II ANTIGEN/SEROLOGIC EQUIVALENT DRB1-2: 15
HLA CLASS II ANTIGEN/SEROLOGIC EQUIVALENT DRB3-1: 52
HLA CLASS II ANTIGEN/SEROLOGIC EQUIVALENT DRB5-2: 51

## 2023-08-13 ENCOUNTER — Telehealth: Admit: 2023-08-13 | Payer: PRIVATE HEALTH INSURANCE | Primary: Internal Medicine

## 2023-08-13 NOTE — Telephone Encounter
 Call placed to assist with scheduling transplant eval testing. No answer, left VM asking for a return call to [phone].

## 2023-08-14 ENCOUNTER — Encounter: Admit: 2023-08-14 | Payer: PRIVATE HEALTH INSURANCE | Primary: Internal Medicine

## 2023-08-14 NOTE — Telephone Encounter
 Received call back from patient and assisted with scheduling testing for transplant evaluation. [place] A/P (08/23/22), Echo (09/11/23) and Stress test (09/18/23)

## 2023-08-21 ENCOUNTER — Encounter
Admit: 2023-08-21 | Payer: PRIVATE HEALTH INSURANCE | Attending: Vascular and Interventional Radiology | Primary: Internal Medicine

## 2023-08-24 ENCOUNTER — Inpatient Hospital Stay: Admit: 2023-08-24 | Discharge: 2023-08-24 | Payer: BLUE CROSS/BLUE SHIELD | Primary: Internal Medicine

## 2023-08-24 DIAGNOSIS — Z01818 Encounter for other preprocedural examination: Secondary | ICD-10-CM

## 2023-08-24 DIAGNOSIS — N186 End stage renal disease: Secondary | ICD-10-CM

## 2023-09-11 ENCOUNTER — Inpatient Hospital Stay: Admit: 2023-09-11 | Discharge: 2023-09-11 | Payer: BLUE CROSS/BLUE SHIELD | Primary: Internal Medicine

## 2023-09-11 DIAGNOSIS — N186 End stage renal disease: Secondary | ICD-10-CM

## 2023-09-11 DIAGNOSIS — Z01818 Encounter for other preprocedural examination: Secondary | ICD-10-CM

## 2023-09-17 ENCOUNTER — Encounter
Admit: 2023-09-17 | Payer: PRIVATE HEALTH INSURANCE | Attending: Vascular and Interventional Radiology | Primary: Internal Medicine

## 2023-09-18 ENCOUNTER — Encounter: Admit: 2023-09-18 | Payer: PRIVATE HEALTH INSURANCE | Attending: Internal Medicine | Primary: Internal Medicine

## 2023-09-18 ENCOUNTER — Inpatient Hospital Stay: Admit: 2023-09-18 | Discharge: 2023-09-18 | Payer: PRIVATE HEALTH INSURANCE | Primary: Internal Medicine

## 2023-09-18 ENCOUNTER — Inpatient Hospital Stay: Admit: 2023-09-18 | Discharge: 2023-09-18 | Payer: BLUE CROSS/BLUE SHIELD | Primary: Internal Medicine

## 2023-09-18 DIAGNOSIS — N186 End stage renal disease: Secondary | ICD-10-CM

## 2023-09-18 DIAGNOSIS — Z01818 Encounter for other preprocedural examination: Secondary | ICD-10-CM

## 2023-09-18 MED ORDER — TECHNETIUM TC-99M TETROFOSMIN INJECTION
Freq: Once | INTRAVENOUS | Status: CP
Start: 2023-09-18 — End: ?
  Administered 2023-09-18: 10:00:00 via INTRAVENOUS

## 2023-09-18 MED ORDER — TECHNETIUM TC-99M TETROFOSMIN INJECTION
Freq: Once | INTRAVENOUS | Status: DC | PRN
Start: 2023-09-18 — End: 2023-09-23
  Administered 2023-09-18: 09:00:00 via INTRAVENOUS

## 2023-09-19 ENCOUNTER — Telehealth: Admit: 2023-09-19 | Payer: PRIVATE HEALTH INSURANCE | Primary: Internal Medicine

## 2023-09-19 ENCOUNTER — Encounter: Admit: 2023-09-19 | Payer: PRIVATE HEALTH INSURANCE | Primary: Internal Medicine

## 2023-09-19 NOTE — Telephone Encounter
 Call placed to schedule HRC. No answer, left VM asking for a return call to [phone].

## 2023-09-19 NOTE — Telephone Encounter
 Patient returned my call and scheduled HRC on 11/02/23.

## 2023-10-29 ENCOUNTER — Encounter: Admit: 2023-10-29 | Payer: PRIVATE HEALTH INSURANCE | Primary: Internal Medicine

## 2023-10-29 DIAGNOSIS — E7800 Pure hypercholesterolemia, unspecified: Secondary | ICD-10-CM

## 2023-10-29 DIAGNOSIS — D649 Anemia, unspecified: Secondary | ICD-10-CM

## 2023-10-29 DIAGNOSIS — N189 Chronic kidney disease, unspecified: Secondary | ICD-10-CM

## 2023-10-29 DIAGNOSIS — I10 Essential (primary) hypertension: Secondary | ICD-10-CM

## 2023-10-29 NOTE — Progress Notes
 [facility]- Routine Waitlist Case ReviewREFERRING PROVIDER:   PRIMARY CARE PROVIDER: Alen Luna [facility] NURSE COORDINATOR: Dwayne Given:  Jacob Luna , [id number] AGE:  63 y.o. GENDER:  male ETHNICITY:  [facility]  (Not currently on dialysis) Transplant Phase: Kidney Transplant Evaluation - 2/24/2025Evaluation, Active: 2/24/2025ABO: BTime on Dialysis: (Not currently on dialysis) CPRA: HLA Class II Antibody, Percent Positive (ID) Date Value Ref Range Status 08/01/2023 0  Final KAS: UnavailableProtocols:  Kidney Dx: Primary: Transplant Evaluation Team: Associate Coordinator: Penny Boxer Patient Active Problem List Diagnosis SNOMED [place](R)  Stage 4 chronic kidney disease (HC Code) CHRONIC KIDNEY DISEASE STAGE 4  Preoperative cardiovascular examination PATIENT ENCOUNTER STATUS  Cardiomyopathy CARDIOMYOPATHY  Essential hypertension ESSENTIAL HYPERTENSION  Sleep apnea SLEEP APNEA  Mixed hyperlipidemia MIXED HYPERLIPIDEMIA  Abnormal ECG ELECTROCARDIOGRAM ABNORMAL  Body mass index (BMI) 30.0-30.9, adult BODY MASS INDEX 30+ - OBESITY The above patient is currently in EVALUATION for a kidney transplant. A chart review has been performed today as part of routine waitlist re-assessment. [name] was last seen in [facility] on 08/01/2023. At that time, he needed to update his cardiac testing. Of note, he was being followed by [facility] for htn and cardiomyopathy,  but appears to be lost to follow up (last seen 03/30/2020). He completed his cardiac testing, but his stress test came back abnormal, so he was referred to [facility]. Required Testing: ECHO 09/11/2023 * Normal left ventricular size, wall thickness, systolic function and wall motion. LVEF calculated by 3DE was 63%.* Normal right ventricular cavity size and systolic function.  Estimated right ventricular systolic pressure is 26 mmHg.* Left atrium is mildly dilated.* Aortic sclerosis without stenosis.  Trace aortic regurgitation.* Moderate mitral regurgitation.* Mild tricuspid regurgitation.* There is evidence of diastolic mitral and tricuspid regurgitation in the setting of AV block.* No significant pericardial effusion.* No prior study available for comparison.Stress Test 09/18/2023 * 1) Abnormal SPECT myocardial perfusion imaging following exercise and at rest.* 2) Perfusion imaging was abnormal showing a small to medium sized, moderate intensity, fixed perfusion defect in the basal to apical inferior wall consistent with scar.* 3) Global left ventricular systolic function was normal and normal regional wall motion.  The left ventricle was mildly enlarged in size.* 4) Stress electrocardiogram was abnormal due to arrhythmia (2nd degree type I at baseline with frequent PVCs, couplets, and NSVT).  The patient exercised at a good workload for age and gender for a total of 9 minutes and 0 seconds using the BRUCE protocol.  Exercise capacity was 10.1 METs.  The maximal heart rate was 131 bpm which represents 83% of the age-predicted maximal heart rate and corresponds to a blunted response for the achieved workload.  Peak stress blood pressure was normal for achieved workload at 204/90 mmHg.* 5) [place] performed for attenuation correction showed coronary artery calcification in the left main artery (mild), left anterior descending artery (moderate), and left circumflex artery (moderate).    Non-cardiac findings of the [place] are described in a separate report from Radiology.* 6) No prior study available for comparison.[place] Abdomen/Pelvis 08/24/2023 Show images for [name] wo IV Contrast FINDINGS:The noncontrast appearance of the liver, spleen, kidneys, adrenal glands, and pancreas is unremarkable except for punctate nonobstructing left renal stones and mild bilateral perinephric stranding. Cholelithiasis is noted. There is no bowel obstruction, ascites, or lymphadenopathy. The abdominal aorta is normal in caliber. There are mild to moderate atherosclerotic calcifications in the infrarenal abdominal aorta and right  common iliac artery. Mild calcifications are noted in the left common iliac artery. No significant calcifications are seen in the bilateral external iliac or common femoral arteries. No aggressive osseous lesions are seen.Colonoscopy 7/31/2020Impression:- The entire examined colon is normal on direct and retroflexion views.- The right colon was re-examined retroflexed.- During exam of the left colon, the patient became bradycardic and vomited and a second-look of the left colonwas not allowed by anesthesiology.- No polyps were noted.- No specimens collected.Recommendation:- Discharge patient to home (ambulatory).- Resume regular diet today.- Continue present medications.- Repeat colonoscopy in 10 years for surveillance.PSA 12/20/20241.14Per guidelines and protocols, the patient will require the following:INCOMPLETE KIDNEY EVALUATION TASKS Due Form 2728 Obtained 08/01/2023 Send Letter to Referring Provider - Patient is in Evaluation 08/29/2023 Review Medical New Patient Clinic - Labs and Imaging 09/03/2023 [place] Scan of Abdomen and Pelvis (Non-contrast) 09/10/2023 PSA (males > 40) 09/10/2023 Cardiology Evaluation / Clearance 09/24/2023 Nutrition assessment complete - ready for medical review 09/24/2023 Pharmacist Clearance 09/24/2023 Social Work Assessment Complete 09/24/2023 Biopsy during colonoscopy pathology results 11/24/2023 SW Annual Review 07/31/2025 Obtain Medical Records  Consent for Recipient Evaluation Signed  Committee Review Presentation  Kidney Evaluation Labs

## 2023-11-02 ENCOUNTER — Ambulatory Visit: Admit: 2023-11-02 | Payer: PRIVATE HEALTH INSURANCE | Primary: Internal Medicine

## 2023-11-02 ENCOUNTER — Ambulatory Visit
Admit: 2023-11-02 | Payer: PRIVATE HEALTH INSURANCE | Attending: Advanced Heart Failure and Transplant Cardiology | Primary: Internal Medicine

## 2023-11-02 VITALS — BP 132/73 | HR 55 | Temp 96.80000°F | Wt 220.2 lb

## 2023-11-02 VITALS — BP 132/73 | HR 55 | Temp 96.80000°F | Ht 71.0 in | Wt 220.2 lb

## 2023-11-02 DIAGNOSIS — Z01810 Encounter for preprocedural cardiovascular examination: Secondary | ICD-10-CM

## 2023-11-02 NOTE — Progress Notes
 [place] CARDIOVASCULAR MEDIC[facility] INITIAL CLINIC VISITReferring Clinician: [facility] History of Present Illness: Jacob Luna is a 63 y.o. male referred for high risk CV evaluation for kidney transplant. He is not currently on dialysis.He has a h/o HTN, HLD, second degree AVB,  also inflammatory arthritis.CV risk factors include FH of heart disease (father had heart transplant, no other FH), HTN.  Denies tobacco use, HLD, or diabetes.He denies any CP, pressure, SOB, dizziness or syncope.  No LE edema, orthopnea or PND. He has a cardiologist at [facility] that he sees for longitudinal cardiac care. Current Outpatient Medications Medication Sig Dispense Refill  acetaminophen  (TYLENOL ) 325 mg tablet Take 2 tablets (650 mg total) by mouth every 6 (six) hours as needed for pain.    allopurinoL (ZYLOPRIM) 100 mg tablet Take 1 tablet (100 mg total) by mouth daily. 90 tablet 3  amLODIPine (NORVASC) 5 mg tablet Take 1 tablet (5 mg total) by mouth daily.    empagliflozin (JARDIANCE) 10 mg tablet Take 1 tablet (10 mg total) by mouth daily. 90 tablet 3  ferrous sulfate  (FEOSOL) 325 mg (65 mg iron ) tablet Take 1 tablet (325 mg total) by mouth daily.    levothyroxine  (SYNTHROID , LEVOTHROID) 125 MCG tablet Take 1 tablet (125 mcg total) by mouth daily.    lisinopriL-hydrochlorothiazide (PRINZIDE,ZESTORETIC) 20-25 mg per tablet Take 1 tablet by mouth daily.    rosuvastatin (CRESTOR) 40 mg tablet Take 1 tablet (40 mg total) by mouth daily.    zolpidem (AMBIEN) 5 mg tablet Take 1 tablet (5 mg total) by mouth nightly as needed (insomnia).   No current facility-administered medications for this visit. No Known AllergiesREVIEW OF SYSTEMS: Negative other than stated above.	PHYSICAL EXAMINATION:BP 132/73 (Site: r a, Position: Sitting, Cuff Size: Large)  - Pulse (!) 55  - Temp (!) 96.8 ?F (36 ?C) (Temporal)  - Ht 5' 11 (1.803 m)  - Wt 99.9 kg  - SpO2 100%  - BMI 30.72 kg/m? Gen NADJVP 7, no HJRCV RRR, no M/R/GPulm CTA b/lAbd soft NT NDExt WWP, no edemaACCESSORY DATA: Lab Results Component Value Date  CREATININE 3.63 (H) 08/13/2023  BUN 64 (H) 08/13/2023  NA 140 08/13/2023  K 4.2 08/13/2023  CL 110 08/13/2023  CO2 23 08/13/2023 Lab Results Component Value Date  WBC 7.7 08/01/2023  HGB 14.2 08/01/2023  HCT 43.50 08/01/2023  MCV 97.3 08/01/2023  PLT 226 08/01/2023 Lab Results Component Value Date  ALT 30 08/01/2023  AST 26 08/01/2023  ALKPHOS 131 (H) 08/01/2023  BILITOT 0.3 08/01/2023 Lab Results Component Value Date  HGBA1C 5.8 (H) 08/01/2023 No results found for: TSH, TSH3GRecent Results (from the past 730 days)ECHOCARDIOGRAM COMPLETE (TTE) 09/11/2023 (Final)Narrative* Normal left ventricular size, wall thickness, systolic function and wall motion. LVEF calculated by 3DE was 63%.* Normal right ventricular cavity size and systolic function.  Estimated right ventricular systolic pressure is 26 mmHg.* Left atrium is mildly dilated.* Aortic sclerosis without stenosis.  Trace aortic regurgitation.* Moderate mitral regurgitation.* Mild tricuspid regurgitation.* There is evidence of diastolic mitral and tricuspid regurgitation in the setting of AV block.* No significant pericardial effusion.* No prior study available for comparison.FindingsResult ReportReported 3DE EF%: 63 %Stress Testing: 09/18/23* 1) Abnormal SPECT myocardial perfusion imaging following exercise and at rest.* 2) Perfusion imaging was abnormal showing a small to medium sized, moderate intensity, fixed perfusion defect in the basal to apical inferior wall consistent with scar.* 3) Global left ventricular systolic function was normal and normal regional wall motion.  The left ventricle was mildly enlarged in size.*  4) Stress electrocardiogram was abnormal due to arrhythmia (2nd degree type I at baseline with frequent PVCs, couplets, and NSVT).  The patient exercised at a good workload for age and gender for a total of 9 minutes and 0 seconds using the BRUCE protocol.  Exercise capacity was 10.1 METs.  The maximal heart rate was 131 bpm which represents 83% of the age-predicted maximal heart rate and corresponds to a blunted response for the achieved workload.  Peak stress blood pressure was normal for achieved workload at 204/90 mmHg.* 5) [place] performed for attenuation correction showed coronary artery calcification in the left main artery (mild), left anterior descending artery (moderate), and left circumflex artery (moderate).    Non-cardiac findings of the [place] are described in a separate report from Radiology.* 6) No prior study available for comparison.Cath: n/aCardiac MRI: n/aIMPRESSION:PLAN: Coronary artery calcificationBy stress test with ? Scar but no reversible defect.  No s/s of ischemia.  Discussed cardiac catheterization when on HD or closer to offers unless symptoms necessitate earlier intervention.- Perform yearly stress tests and echocardiograms with f/u in high risk CV clinic annually-f/u with primary cardiologist annually- Consider cardiac catheterization if dialysis is initiated or if closer to transplant offers.- Monitor for symptoms such as chest pain or pressure that may necessitate earlier intervention.-prior notes refer to cardiomyopathy, I do not see a h/o reduced LVEF by TTE.  He does have mild concentric LVH which is likely 2/2 HTN and renal disease.  Cont to monitor.  -moderate mitral regurgitation by recent TTE, cont to monitor with yearly TTE-h/o AV block, no dizziness or syncope, cont annual f/u and close f/u with primary cardiologist Marston Skiff, MD, FACCAssistant Professor of Medicine (Cardiovascular Medicine)Advanced Heart Failure and Transplant CardiologyYale School of Medicine

## 2023-12-03 ENCOUNTER — Inpatient Hospital Stay: Admit: 2023-12-03 | Discharge: 2023-12-03 | Payer: BLUE CROSS/BLUE SHIELD | Primary: Internal Medicine

## 2023-12-03 ENCOUNTER — Ambulatory Visit: Admit: 2023-12-03 | Payer: BLUE CROSS/BLUE SHIELD | Attending: Nephrology | Primary: Internal Medicine

## 2023-12-03 ENCOUNTER — Ambulatory Visit: Admit: 2023-12-03 | Payer: BLUE CROSS/BLUE SHIELD | Primary: Internal Medicine

## 2023-12-03 DIAGNOSIS — N184 Chronic kidney disease, stage 4 (severe): Secondary | ICD-10-CM

## 2023-12-03 DIAGNOSIS — M1000 Idiopathic gout, unspecified site: Secondary | ICD-10-CM

## 2023-12-03 LAB — PHOSPHORUS     (BH GH L LMW YH): BKR PHOSPHORUS: 3.5 mg/dL (ref 2.2–4.5)

## 2024-01-07 ENCOUNTER — Encounter
Admit: 2024-01-07 | Payer: PRIVATE HEALTH INSURANCE | Attending: Pharmacist Clinician (PhC)/ Clinical Pharmacy Specialist | Primary: Internal Medicine

## 2024-01-07 ENCOUNTER — Encounter: Admit: 2024-01-07 | Payer: PRIVATE HEALTH INSURANCE | Primary: Internal Medicine

## 2024-01-07 NOTE — Progress Notes
 [facility] Trans[facility] CenterKidney Transplant Program- Medical Review Board Transplant Nurse Coordinator Summary REFERRING PROVIDER:   PRIMARY CARE PROVIDER: [facility] NURSE COORDINATOR: SHARLENE NICELY:  Clem Grumbles , [id number] AGE:  63 y.o. GENDER:  male ETHNICITY:  [facility]  (Not currently on dialysis) Transplant Phase: Kidney Transplant Evaluation - 2/24/2025Evaluation, Active: 7/3/2025ABO: BTime on Dialysis: (Not currently on dialysis) CPRA: HLA Class II Antibody, Percent Positive (ID) Date Value Ref Range Status 08/01/2023 0  Final KAS: UnavailableProtocols:  Kidney Dx: Primary: Transplant Evaluation Team: Nephrologist: Crystal Von Associate Coordinator: [place] [facility] Patient Active Problem List Diagnosis SNOMED [place](R)  Stage 4 chronic kidney disease (HC Code)  CHRONIC KIDNEY DISEASE STAGE 4  Preoperative cardiovascular examination PATIENT ENCOUNTER STATUS  Cardiomyopathy  CARDIOMYOPATHY  Essential hypertension ESSENTIAL HYPERTENSION  Sleep apnea SLEEP APNEA  Mixed hyperlipidemia MIXED HYPERLIPIDEMIA  Abnormal ECG ELECTROCARDIOGRAM ABNORMAL  Body mass index (BMI) 30.0-30.9, adult BODY MASS INDEX 30+ - OBESITY REASON FOR PRESENTATION: Inactive Listing - Pre-emptive vs. Discussion HIGHLIGHTS:  Jacob Luna is a 63 y.o. male with ESRD secondary to . He was referred by Dr.   for evaluation as of 03/23/2023 and completed initial evaluation at the [facility] on 08/27/2023. Willing to accept Blood Transfusion: YesIntercurrent events: Last seen in [facility] with Dr. Ozell on 11/02/2022. Prior to that, seen in [facility] in January 2025. His work up is now complete, and he has a qualifying GFR from 09/08/2019 and is a potential A2 to B candidate, which would put him in range of organ offers. Dr. Ozell had recommended a cardiac cath if on dialysis or closer to organ offers. Presents today to discuss listing vs need for cardiac cath. Evaluation highlights: ECHO 09/11/2023 * Normal left ventricular size, wall thickness, systolic function and wall motion. LVEF calculated by 3DE was 63%.* Normal right ventricular cavity size and systolic function.  Estimated right ventricular systolic pressure is 26 mmHg.* Left atrium is mildly dilated.* Aortic sclerosis without stenosis.  Trace aortic regurgitation.* Moderate mitral regurgitation.* Mild tricuspid regurgitation.* There is evidence of diastolic mitral and tricuspid regurgitation in the setting of AV block.* No significant pericardial effusion.* No prior study available for comparison. Stress Test 09/18/2023 * 1) Abnormal SPECT myocardial perfusion imaging following exercise and at rest.* 2) Perfusion imaging was abnormal showing a small to medium sized, moderate intensity, fixed perfusion defect in the basal to apical inferior wall consistent with scar.* 3) Global left ventricular systolic function was normal and normal regional wall motion.  The left ventricle was mildly enlarged in size.* 4) Stress electrocardiogram was abnormal due to arrhythmia (2nd degree type I at baseline with frequent PVCs, couplets, and NSVT).  The patient exercised at a good workload for age and gender for a total of 9 minutes and 0 seconds using the BRUCE protocol.  Exercise capacity was 10.1 METs.  The maximal heart rate was 131 bpm which represents 83% of the age-predicted maximal heart rate and corresponds to a blunted response for the achieved workload.  Peak stress blood pressure was normal for achieved workload at 204/90 mmHg.* 5) [place] performed for attenuation correction showed coronary artery calcification in the left main artery (mild), left anterior descending artery (moderate), and left circumflex artery (moderate).    Non-cardiac findings of the [place] are described in a separate report from Radiology.* 6) No prior study available for comparison. [place] Abdomen/Pelvis 08/24/2023 Show images for [place] Abdomen Pelvis wo IV Contrast FINDINGS:The noncontrast appearance of the liver,  spleen, kidneys, adrenal glands, and pancreas is unremarkable except for punctate nonobstructing left renal stones and mild bilateral perinephric stranding. Cholelithiasis is noted. There is no bowel obstruction, ascites, or lymphadenopathy. The abdominal aorta is normal in caliber. There are mild to moderate atherosclerotic calcifications in the infrarenal abdominal aorta and right common iliac artery. Mild calcifications are noted in the left common iliac artery. No significant calcifications are seen in the bilateral external iliac or common femoral arteries. No aggressive osseous lesions are seen. Colonoscopy 7/31/2020Impression:- The entire examined colon is normal on direct and retroflexion views.- The right colon was re-examined retroflexed.- During exam of the left colon, the patient became bradycardic and vomited and a second-look of the left colonwas not allowed by anesthesiology.- No polyps were noted.- No specimens collected.Recommendation:- Discharge patient to home (ambulatory).- Resume regular diet today.- Continue present medications.- Repeat colonoscopy in 10 years for surveillance. PSA 12/20/20241.14HRC appt with Dr. Ozell 5/2/2025PLAN:  Coronary artery calcificationBy stress test with ? Scar but no reversible defect.  No s/s of ischemia.  Discussed cardiac catheterization when on HD or closer to offers unless symptoms necessitate earlier intervention.- Perform yearly stress tests and echocardiograms with f/u in high risk CV clinic annually-f/u with primary cardiologist annually- Consider cardiac catheterization if dialysis is initiated or if closer to transplant offers.- Monitor for symptoms such as chest pain or pressure that may necessitate earlier intervention. -prior notes refer to cardiomyopathy, I do not see a h/o reduced LVEF by TTE.  He does have mild concentric LVH which is likely 2/2 HTN and renal disease.  Cont to monitor.  -moderate mitral regurgitation by recent TTE, cont to monitor with yearly TTE-h/o AV block, no dizziness or syncope, cont annual f/u and close f/u with primary cardiologist  Further outstanding testing at this time per protocol: INCOMPLETE KIDNEY EVALUATION TASKS Due Form 2728 Obtained 08/01/2023 Send Letter to Referring Provider - Patient is in Evaluation 08/29/2023 Review Medical New Patient Clinic - Labs and Imaging 09/03/2023 [place] Scan of Abdomen and Pelvis (Non-contrast) 09/10/2023 PSA (males > 40) 09/10/2023 Cardiology Evaluation / Clearance 09/24/2023 Nutrition assessment complete - ready for medical review 09/24/2023 Pharmacist Clearance 09/24/2023 Social Work Assessment Complete 09/24/2023 Biopsy during colonoscopy pathology results 11/24/2023 SW Annual Review 07/31/2025 Obtain Medical Records  Consent for Recipient Evaluation Signed  Committee Review Presentation  Kidney Evaluation Labs  Electronically Signed by Connell Stanley, RN, January 07, 2024 7:20 AM

## 2024-01-08 ENCOUNTER — Encounter: Admit: 2024-01-08 | Payer: PRIVATE HEALTH INSURANCE | Primary: Internal Medicine

## 2024-01-08 DIAGNOSIS — N186 End stage renal disease: Principal | ICD-10-CM

## 2024-01-08 NOTE — Committee Review
 [facility] Trans[facility] Medical Review Jacob Luna is a 63 y.o. male with ESRD secondary to . He was evaluated at the [facility] [place] on 08/27/2023. The patient has not been maintained on dialysis.His case was formally presented to the Medical Review Board on  01/07/2024. Jacob Luna blood type has been confirmed by two separate samples to be B.The unanimous decision of this committee found that the patient  is to be listed status 7.Per the decision of the Multidisciplinary Team, the patient does meet suitability for transplant listing. The patient will be notified in writing of this decision.Additional Committee Comments:-[place] reviewed by surgery. Acceptable vascular targets. -Needs HBV and ABO titer labs	-If acceptable for A2 to B, refer to Nagpal for cathElectronically Signed by Connell Stanley, RN, January 08, 2024

## 2024-01-08 NOTE — Progress Notes
 Called pt to discuss MRB outcome--pt approved for INACTIVE listing, but needs updated labs first. Pt will get labs drawn today or tomorrow. Pt understands and agrees with plan, all questions addressed.

## 2024-01-09 ENCOUNTER — Ambulatory Visit: Admit: 2024-01-09 | Payer: BLUE CROSS/BLUE SHIELD | Attending: Internal Medicine | Primary: Internal Medicine

## 2024-01-09 ENCOUNTER — Encounter: Admit: 2024-01-09 | Payer: PRIVATE HEALTH INSURANCE | Attending: Internal Medicine | Primary: Internal Medicine

## 2024-01-09 ENCOUNTER — Inpatient Hospital Stay: Admit: 2024-01-09 | Discharge: 2024-01-09 | Payer: BLUE CROSS/BLUE SHIELD | Primary: Internal Medicine

## 2024-01-09 DIAGNOSIS — Z01810 Encounter for preprocedural cardiovascular examination: Secondary | ICD-10-CM

## 2024-01-09 DIAGNOSIS — Z6830 Body mass index (BMI) 30.0-30.9, adult: Secondary | ICD-10-CM

## 2024-01-09 DIAGNOSIS — N184 Chronic kidney disease, stage 4 (severe): Secondary | ICD-10-CM

## 2024-01-09 DIAGNOSIS — I429 Cardiomyopathy, unspecified: Secondary | ICD-10-CM

## 2024-01-09 DIAGNOSIS — E782 Mixed hyperlipidemia: Secondary | ICD-10-CM

## 2024-01-09 DIAGNOSIS — N186 End stage renal disease: Secondary | ICD-10-CM

## 2024-01-09 DIAGNOSIS — G4730 Sleep apnea, unspecified: Secondary | ICD-10-CM

## 2024-01-09 DIAGNOSIS — I10 Essential (primary) hypertension: Secondary | ICD-10-CM

## 2024-01-09 LAB — HEPATITIS B CORE ANTIBODY, TOTAL: BKR HEPATITIS B CORE TOTAL ANTIBODY: NEGATIVE

## 2024-01-09 LAB — HEPATITIS B SURFACE ANTIGEN     (BH GH L LMW YH): BKR HEPATITIS B SURFACE ANTIGEN: NEGATIVE

## 2024-01-09 LAB — HEPATITIS B SURFACE ANTIBODY     (BH GH L LMW YH): BKR HEP B SURFACE AB INITIAL RESULT: <8 m[IU]/mL (ref >=12)

## 2024-01-15 ENCOUNTER — Encounter: Admit: 2024-01-15 | Payer: PRIVATE HEALTH INSURANCE | Primary: Internal Medicine

## 2024-01-15 NOTE — Progress Notes
 Koltin Pasion's blood type has been verified by 2 source documents within the electronic medical record by this RN and [place], RN.  08/01/23 12:10 08/01/23 12:23 ABO Grouping B B RH Type POS POS The patient will be listed INACTIVE in UNET as a kidney transplant candidate.eGFR:  Latest Reference Range & Units 07/01/20 11:08 eGFR (NON African-American) > OR = 60 mL/min/1.40m2 19 (L) (L): Data is abnormally lowAlbumin:  Latest Reference Range & Units 08/01/23 12:42 Albumin 3.6 - 5.1 g/dL 4.8 Estimated body mass index is 30.54 kg/m? as calculated from the following:  Height as of 12/06/23: 5' 11 (1.803 m).  Weight as of 12/06/23: 99.3 kg.Electronically Signed by Connell Stanley, RN, January 15, 2024

## 2024-01-15 NOTE — Progress Notes
 Called to inform patient that they were listed inactive on the UNOS kidney transplant waitlist at our center today.  A letter will be sent to the patient, referring nephrologist and dialysis center.  Discussed the following education and plan of care with the patient:1. A blood sample is needed every other month to maintain ACTIVE status on the list to complete compatibility testing with potential donors. This blood sample should be drawn at the early part of the week at any [facility] or by their dialysis center.2. Keep us  informed of any changes to your medical condition, hospitalizations, or vacations out of the area.  We may need to make you temporarily inactive on the waitlist if your medical condition changes. You would still accrue time but would not receive organ offers until reactivated on the waitlist.     3. Keep us  updated on any changes to contact information.4. Age appropriate cancer screenings and yearly cardiac testing will need to be kept up-to-date.  5. All potential donors can call the donor referral line at [phone].    Also discussed A2 to B kidney offer with patient, as ABO titer indicates pt is ACCEPTABLE for this offer. Pt verbally consents to accept A2 to B kidneys; consent form put in mail for pt to sign and return. Patient understands instructions and agrees with plan. All questions addressed.

## 2024-01-17 ENCOUNTER — Encounter: Admit: 2024-01-17 | Payer: PRIVATE HEALTH INSURANCE | Primary: Internal Medicine

## 2024-01-17 ENCOUNTER — Inpatient Hospital Stay: Admit: 2024-01-17 | Discharge: 2024-01-17 | Payer: BLUE CROSS/BLUE SHIELD | Primary: Internal Medicine

## 2024-01-17 LAB — HLA ABO TITER (YMG)
HLA ABO TITER 1, AGG: 1
HLA ABO TITER 2, AGG: 8
HLA CLASS I ANTIBODY TEST RESULT (S.A.): NEGATIVE
HLA CLASS II ANTIBODY TEST RESULT (S.A.): NEGATIVE
HLA CLASS II CALCULATED PERCENT REACTIVE ANTIBODY (CPRA): 0

## 2024-01-31 ENCOUNTER — Encounter: Admit: 2024-01-31 | Payer: PRIVATE HEALTH INSURANCE | Attending: Gastroenterology | Primary: Internal Medicine

## 2024-01-31 NOTE — Other
 TRANSPLANT PHARMACY: PRE-TRANSPLANT HEPATITIS B VACCINATION ASSESSMENTI reviewed Jacob Luna's chart for assessment of vaccinations prior to transplant.Immunization History Administered Date(s) Administered Comments  COVID-19 Vaccine - PFIZER 10/16/2019    11/06/2019    06/06/2020   COVID-19, Moderna, 68yr + up, 50 mcg/0.5 mL 06/09/2022   COVID-19, PFIZER 12Y up,mRNA,TRIS 11/24/2020   COVID-19, PFIZER Bivalent, 12 Yrs+, 0.82mL 07/02/2021  Vaccine Serology Patient immune? Recommend patient to receive vaccine Hepatitis B Hepatitis B Surface Antibody Date Value Ref Range Status 01/09/2024 <8.00 > or = 12 mIU/mL Final   Comment:   Patient is considered to be not immune to infection with HBV.This assay was restandardized from the 1st to the 2nd [facility] International Reference Standard on 12/12/16. A slight upward shift in values was noted.   no - patient is not fully vaccinated. Last sAb is < 12 mIU/mL. yes Patient should receive annual influenza vaccine and COVID booster doses as indicated by CDC.Recommend patient to receive the vaccinations above prior to transplant.Delon Hail, PharmDJuly 31, 20253:23 PM

## 2024-03-13 ENCOUNTER — Encounter: Admit: 2024-03-13 | Payer: PRIVATE HEALTH INSURANCE | Primary: Internal Medicine

## 2024-03-13 NOTE — Progress Notes
 Called pt to review ABO titer results and discuss need for cardiac cath. No answer, VM left with instructions to call back.

## 2024-03-18 ENCOUNTER — Encounter: Admit: 2024-03-18 | Payer: PRIVATE HEALTH INSURANCE | Primary: Internal Medicine

## 2024-03-18 NOTE — Progress Notes
 Called and discussed need for cardiac cath with pt. He understands and agrees with plan--referral sent to Dr. Ether. All questions addressed.

## 2024-03-31 ENCOUNTER — Encounter: Admit: 2024-03-31 | Payer: PRIVATE HEALTH INSURANCE | Primary: Internal Medicine

## 2024-03-31 NOTE — Progress Notes
 9/29/2025Per OPTN Policy 3.7.D a thorough review of patient's records have been conducted by Doyal Ferguson, PA.HAZIEL MOLNER was found to have earlier qualifying data; submission was made and approved by [facility] on 01/17/2024.  EPIC wait time has been updated to 09/08/2019 and notification letter sent to patient. Shelba Imam, CPHT

## 2024-04-24 ENCOUNTER — Inpatient Hospital Stay: Admit: 2024-04-24 | Discharge: 2024-04-24 | Payer: BLUE CROSS/BLUE SHIELD | Primary: Internal Medicine

## 2024-04-24 ENCOUNTER — Ambulatory Visit: Admit: 2024-04-24 | Payer: BLUE CROSS/BLUE SHIELD | Attending: Nephrology | Primary: Internal Medicine

## 2024-04-24 ENCOUNTER — Ambulatory Visit: Admit: 2024-04-24 | Payer: BLUE CROSS/BLUE SHIELD | Attending: Internal Medicine | Primary: Internal Medicine

## 2024-04-24 ENCOUNTER — Encounter: Admit: 2024-04-24 | Payer: PRIVATE HEALTH INSURANCE | Attending: Nephrology | Primary: Internal Medicine

## 2024-04-24 DIAGNOSIS — Z01818 Encounter for other preprocedural examination: Secondary | ICD-10-CM

## 2024-04-24 DIAGNOSIS — I429 Cardiomyopathy, unspecified: Secondary | ICD-10-CM

## 2024-04-24 DIAGNOSIS — E782 Mixed hyperlipidemia: Secondary | ICD-10-CM

## 2024-04-24 DIAGNOSIS — N184 Chronic kidney disease, stage 4 (severe): Principal | ICD-10-CM

## 2024-04-24 DIAGNOSIS — G4730 Sleep apnea, unspecified: Secondary | ICD-10-CM

## 2024-04-24 DIAGNOSIS — I10 Essential (primary) hypertension: Secondary | ICD-10-CM

## 2024-04-24 DIAGNOSIS — N186 End stage renal disease: Secondary | ICD-10-CM

## 2024-04-24 LAB — PROTEIN, TOTAL W/CREATININE, URINE, RANDOM     (BH GH LMW YH)
BKR CREATININE, URINE, RANDOM: 149 mg/dL
BKR PROTEIN URINE RANDOM: 0.82 g/L
BKR PROTEIN/CREATININE RATIO, URINE, RANDOM: 0.55 mg/mg{creat} — ABNORMAL HIGH (ref <0.10)

## 2024-04-24 LAB — URINALYSIS-MACROSCOPIC W/REFLEX MICROSCOPIC
BKR BILIRUBIN, UA: NEGATIVE
BKR BLOOD, UA: NEGATIVE
BKR KETONES, UA: NEGATIVE
BKR LEUKOCYTE ESTERASE, UA: NEGATIVE
BKR NITRITE, UA: NEGATIVE
BKR PH, UA: 6 (ref 5.5–7.5)
BKR SPECIFIC GRAVITY, UA: 1.013 (ref 1.005–1.030)
BKR UROBILINOGEN, UA: <2 mg/dL (ref <=2.0)

## 2024-04-24 LAB — URINE MICROSCOPIC     (BH GH LMW YH)
BKR HYALINE CASTS, UA (INSTRUMENT): 1 /LPF (ref 0–3)
BKR RBC/HPF, UA (INSTRUMENT): <1 /HPF (ref 0–2)
BKR URINE SQUAMOUS EPITHELIAL CELLS, UA (INSTRUMENT): <1 /HPF (ref 0–5)
BKR WBC/HPF, UA (INSTRUMENT): 1 /HPF (ref 0–5)

## 2024-04-24 LAB — PHOSPHORUS     (BH GH L LMW YH): BKR PHOSPHORUS: 4 mg/dL (ref 2.2–4.5)

## 2024-04-28 ENCOUNTER — Ambulatory Visit: Admit: 2024-04-28 | Payer: PRIVATE HEALTH INSURANCE | Attending: Cardiovascular Disease | Primary: Internal Medicine

## 2024-04-28 ENCOUNTER — Inpatient Hospital Stay: Admit: 2024-04-28 | Payer: PRIVATE HEALTH INSURANCE | Primary: Internal Medicine

## 2024-04-28 ENCOUNTER — Encounter: Admit: 2024-04-28 | Payer: PRIVATE HEALTH INSURANCE | Attending: Cardiovascular Disease | Primary: Internal Medicine

## 2024-04-28 ENCOUNTER — Ambulatory Visit: Admit: 2024-04-28 | Payer: BLUE CROSS/BLUE SHIELD | Primary: Internal Medicine

## 2024-04-28 VITALS — BP 133/69 | HR 57 | Temp 97.50000°F | Ht 71.0 in | Wt 210.3 lb

## 2024-04-28 DIAGNOSIS — E7800 Pure hypercholesterolemia, unspecified: Principal | ICD-10-CM

## 2024-04-28 DIAGNOSIS — I441 Atrioventricular block, second degree: Secondary | ICD-10-CM

## 2024-04-28 DIAGNOSIS — D649 Anemia, unspecified: Secondary | ICD-10-CM

## 2024-04-28 DIAGNOSIS — I2510 Atherosclerotic heart disease of native coronary artery without angina pectoris: Secondary | ICD-10-CM

## 2024-04-28 DIAGNOSIS — N289 Disorder of kidney and ureter, unspecified: Secondary | ICD-10-CM

## 2024-04-28 DIAGNOSIS — I10 Essential (primary) hypertension: Secondary | ICD-10-CM

## 2024-04-28 DIAGNOSIS — Z01818 Encounter for other preprocedural examination: Secondary | ICD-10-CM

## 2024-04-28 NOTE — Progress Notes [1]
 INTERVENTIONAL CARDIOLOGY CLINIC NOTEHISTORY OF PRESENT ILLNESS:  [name] is a 63 y.o. gentleman with a relevant history as noted and reviewed below.  His past medical history is significant for HTN, HLD, second degree AVB, and inflammatory arthritis.He reports overall feeling well today.He was seen by my colleague Dr. Ozell in May 2025.  Coronary angiography was recommended when getting closer to the time of a transplant, which according to our transplant colleagues he now is.  He denies any personal history of heart disease in the past. He performed well on a treadmill stress test earlier in 09/2023.He has a history of HTN and is on amlodipine, lisinopril and HCTZ.He has a history of HLD and is on rosuvastatin.He has a history of stage 4 CKD. The kidney disease is attributed to his HTN and NSAID use.His heart rate is low in the office today. He confirms that his heart rates are generally on the low side and that this is typical.  He remains physically active and his exercise includes stationary bike use.He does not describe any chest pain or shortness of breath. Denies syncope or palpitations. No orthopnea or PND.SOCIAL HISTORY: Denies tobacco or alcohol use.FAMILY HISTORY: Father had heart transplant in his 46s.  REVIEW OF SYSTEMS:  12-point review of systems was conducted and was negative with exception of HPI.  PHYSICAL EXAM:  Vitals: BP 133/69 (Site: r a, Position: Sitting, Cuff Size: Medium)  - Pulse (!) 57  - Temp 97.5 ?F (36.4 ?C) (Temporal)  - Ht 5' 11  - Wt 210 lb 4.8 oz  - SpO2 100%  - BMI 29.33 kg/m?  GEN: appears well, in no distressHEENT: no JVD CV: RRR no m/r/g CHEST: CTAB ABD: soft, NT EXT:  Warm without edema. 2+ bilateral radial pulses.NEURO: AAO x 3 RELEVANT LABS / STUDIES:  04/2024: K 4.6, Cr 3.72. Hb 12.3, Plt 207.07/2023: TC 109, TG 102, HDL 40, LDL 50.04/28/2024 EKG: Sinus rhythm at 50 bpm with 2:1 AV block.09/2023 NM Myocardial Perfusion SPECT (stress and rest):* 1) Abnormal SPECT myocardial perfusion imaging following exercise and at rest.* 2) Perfusion imaging was abnormal showing a small to medium sized, moderate intensity, fixed perfusion defect in the basal to apical inferior wall consistent with scar.* 3) Global left ventricular systolic function was normal and normal regional wall motion.  The left ventricle was mildly enlarged in size.* 4) Stress electrocardiogram was abnormal due to arrhythmia (2nd degree type I at baseline with frequent PVCs, couplets, and NSVT).  The patient exercised at a good workload for age and gender for a total of 9 minutes and 0 seconds using the BRUCE protocol.  Exercise capacity was 10.1 METs.  The maximal heart rate was 131 bpm which represents 83% of the age-predicted maximal heart rate and corresponds to a blunted response for the achieved workload.  Peak stress blood pressure was normal for achieved workload at 204/90 mmHg.* 5) [place] performed for attenuation correction showed coronary artery calcification in the left main artery (mild), left anterior descending artery (moderate), and left circumflex artery (moderate).    Non-cardiac findings of the [place] are described in a separate report from Radiology.* 6) No prior study available for comparison.09/2023 TTE:* Normal left ventricular size, wall thickness, systolic function and wall motion. LVEF calculated by 3DE was 63%.* Normal right ventricular cavity size and systolic function.  Estimated right ventricular systolic pressure is 26 mmHg.* Left atrium is mildly dilated.* Aortic sclerosis without stenosis.  Trace aortic regurgitation.* Moderate mitral regurgitation.* Mild tricuspid regurgitation.*  There is evidence of diastolic mitral and tricuspid regurgitation in the setting of AV block.* No significant pericardial effusion.* No prior study available for comparison.IMPRESSION & PLAN: 63 year old gentleman with advanced CKD (Cr 3.72) who is referred for pre-renal transplant coronary angiography due to his coronary calcification and abnormal stress test with inferior scar in March 2025.    He is now within a closer range for kidney transplant offers.  We reviewed that the risk of AKI and even possibly needing dialysis is low but not zero due to his underlying CKD.  I will schedule a low contrast diagnostic coronary angiogram in the next few weeks.  He will need pre and post-procedural hydration.  500cc x 1 hour prior to procedure.  Post-procedure hydration over 4 hours to be guided by LVEDP.   I will also get him a 72 hour holter monitor in view of his 2:1 AVB, although chronic without clinical signs of symptoms high grade heart block.  Lucendia Cha, MDInterventional Cardiology AttendingYale-New South Meadows Endoscopy Center LLC Cell: [phone] Clem JINNY Grumbles or their legally authorized representative verbally consented to the use of a virtual scribe to assist with the completion of the visit documentation.Scribed for Cha Lucendia, MD by Elnora Louder, medical scribe October 27, 2025The documentation recorded by the scribe accurately reflects the services I personally performed and the decisions made by me. I reviewed and confirmed all material entered and/or pre-charted by the scribe.

## 2024-05-01 ENCOUNTER — Telehealth: Admit: 2024-05-01 | Payer: PRIVATE HEALTH INSURANCE | Attending: Cardiovascular Disease | Primary: Internal Medicine

## 2024-05-01 NOTE — Telephone Encounter [36]
 Left a VM for patient relaying pre procedure instructions per Dr Ether for [facility] on 05/08/24. Ok to continue your medications. Labs are up to date. Will need a ride to and from. Expect a call from [place] with arrival time and eating instructions. Call back number provided.

## 2024-05-05 ENCOUNTER — Telehealth: Admit: 2024-05-05 | Payer: PRIVATE HEALTH INSURANCE | Attending: Cardiovascular Disease | Primary: Internal Medicine

## 2024-05-05 NOTE — Telephone Encounter [36]
 Patient requesting to reschedule his LHC procedure with Dr.Nagpal on 05/08/24.

## 2024-05-06 ENCOUNTER — Telehealth: Admit: 2024-05-06 | Payer: PRIVATE HEALTH INSURANCE | Attending: Cardiovascular Disease | Primary: Internal Medicine

## 2024-05-06 NOTE — Telephone Encounter [36]
 Patient requesting to reschedule his LHC procedure with Dr.Nagpal on 05/08/24.

## 2024-05-08 ENCOUNTER — Telehealth: Admit: 2024-05-08 | Payer: PRIVATE HEALTH INSURANCE | Attending: Cardiovascular Disease | Primary: Internal Medicine

## 2024-05-08 NOTE — Telephone Encounter [36]
 LVM on patient's number as well as alternate contact to relay instructions per Dr. Ether.

## 2024-05-08 NOTE — Telephone Encounter [36]
 VM from [facility] for patient of Dr. Dolly with a critical result on 72-hr holter monitor. Requested call back for details [phone].Pt had a New Pt OV with Dr. Ether on 10/27 in [place] to [employer]. Pt had 72-hr holter from 10/27-10/30. Reports that they received the patient's monitor back for analysis. 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 Nagpal for awareness, and to [facility] Nurse pool for pt follow up as needed.

## 2024-05-09 NOTE — Telephone Encounter [36]
 Spoke with patient to relay monitor results per Dr Ether The heart monitor gave preliminary results that showed a slow heart rate at times in an abnormal rhythm.  I reviewed with Dr. Nadeen.  He will arrange an office visit.  A pacemaker is likely needed but not an emergency.  He will review with him in the office.  The office will call him to schedule an appointment.   In the interim, if significant lightheadedness, dizziness, or syncope, needs to call 911 and come to the ER.  Patient denies any current lightheadedness, dizziness, or syncope. Verbalized understanding.

## 2024-05-12 ENCOUNTER — Telehealth: Admit: 2024-05-12 | Payer: PRIVATE HEALTH INSURANCE | Attending: Cardiovascular Disease | Primary: Internal Medicine

## 2024-05-12 NOTE — Telephone Encounter [36]
 Called pt to schedule new pt 32W per provider. appt offered 12/29/24 no answer LVM Letter sent out

## 2024-05-13 ENCOUNTER — Telehealth: Admit: 2024-05-13 | Payer: PRIVATE HEALTH INSURANCE | Attending: Cardiovascular Disease | Primary: Internal Medicine

## 2024-05-13 NOTE — Telephone Encounter [36]
 Left a VM message for patient relaying pre procedure instructions for patient per Dr Ether for [facility] on 05/19/24. Ok to continue your medications. Will need updated labs, ordered to [facility]. Will need a ride to and from. Expect a call from [facility] with arrival time and eating instructions as well as diabetic med instruction. Call back number provided Per Dr Ether patient will need pre and post hydration He will need pre and post-procedural hydration. 500cc x 1 hour prior to procedure. Post-procedure hydration over 4 hours to be guided by LVEDP.

## 2024-05-15 ENCOUNTER — Telehealth: Admit: 2024-05-15 | Payer: PRIVATE HEALTH INSURANCE | Attending: Cardiovascular Disease | Primary: Internal Medicine

## 2024-05-15 NOTE — Telephone Encounter [36]
 Jacob Luna called with a request to delay his coronary angiogram to January.He is hesitant about proceeding with cardiac catheterization to his chronic kidney disease and risk needing hemodialysis.He had an abnormal stress test in March of 2025.  There was basal to apical inferior scar.  He also had PVCs and NSVT with exercise.Due to 2nd degree AV block on repeat ECGs I obtained a Holter monitor.  This showed periods of complete heart block.  I reviewed this with Jacob Luna from EP.  A pacemaker non urgently is likely necessary prior to kidney transplant progressive conduction disease and the risk of permanent complete heart block in the future.I reviewed all this with Jacob Luna again today.  We would of course pre and post hydrate him for the cardiac catheterization.  I would tentatively plan on a diagnostic only procedure in the absence of a critical stenosis to limit dye as much as possible. In light of his advanced kidney disease there is of course a small risk of needing dialysis regardless.However, he would not be a candidate for kidney transplant if we did not proceed with this as well.We agreed that he would speak with his nephrologist Jacob Luna a bit more prior to proceeding. I sent his nephrologist a message about the above.  For now we will hold off on Monday's procedure and reschedule accordingly.I would like for him to see Jacob Luna in the near future.  The current appointment is scheduled for June 2026.He remains asymptomatic from the cardiovascular standpoint at this time.

## 2024-05-15 NOTE — Telephone Encounter [36]
 Patient is requesting to reschedule his cath with Dr.Nagpal to the end of January.

## 2024-05-26 ENCOUNTER — Encounter: Admit: 2024-05-26 | Payer: PRIVATE HEALTH INSURANCE | Attending: Cardiovascular Disease | Primary: Internal Medicine

## 2024-05-26 NOTE — Result Encounter Note [77]
 The results of the heart rhythm longer were reviewed in detail with Dr. Nadeen, who was also able to review the strips of the rhythm analysis.  He will see the patient in consultation for a possible pacemaker. The patient had wished to hold off on coronary angiography at this time until further review with his nephrologist.  I have spoken with him, and he will discuss with the transplant Service as well as the patient and then get back to me.

## 2024-06-06 ENCOUNTER — Telehealth: Admit: 2024-06-06 | Payer: PRIVATE HEALTH INSURANCE | Attending: Cardiovascular Disease | Primary: Internal Medicine

## 2024-06-06 DIAGNOSIS — I2510 Atherosclerotic heart disease of native coronary artery without angina pectoris: Principal | ICD-10-CM

## 2024-06-06 NOTE — Telephone Encounter [36]
 Called pt to move up NP appt no answer LVM

## 2024-06-06 NOTE — Telephone Encounter [36]
 Interventional cardiologyI communicated with Dr. Charlena Pink.  He discussed the case with transplant team.They have decided that he does not necessarily need a transplant at this time.  Thus from their perspective cardiac catheterization is not necessary for the preoperative evaluation at this time.The patient in my prior conversations with him had decided to reschedule and hold off on coronary angiography due to his concerns about marginal renal function and the potential need for dialysis should he receive contrast dye.  We had reviewed that the risk is low but not 0.Again, he had an abnormal exercise nuclear stress test in March of 2025 which showed inferior scar.  There did not appear to be an ischemic defect.  However, there was some brief NSVT.On Holter monitoring he ended up having asymptomatic intermittent complete heart block.  This was reviewed with EP.  A non urgent consultation was felt to be warranted, however urgent pacemaker was not felt to be indicated.Of note, there was normal regional wall motion on the nuclear stress test and there was no wall motion abnormality on a closely timed echocardiogram with a normal ejection fraction.There are certainly some confounding results.However, it is possible that inferior perfusion abnormalities could be contributing to the intermittent complete heart block.At the very least I feel we should repeat an exercise nuclear stress test, to again risk stratify and assess his cardiovascular symptoms and rhythm with exertion.  After this we can again have an informed discussion about the need for coronary angiography.He is scheduled to see Dr. Pennye her from EP in January.I left a detailed voicemail summarizing the above.  I will ask my team to help may contact him again on Monday if he does not call back soon to review in greater detail and answer his questions.  The nuclear stress test is ordered and should be scheduled as soon as possible.Jacob Luna, MDInterventional Cardiology AttendingAssistant Professor of Medicine [facility] of [facility] Cell: [phone]

## 2024-06-09 NOTE — Telephone Encounter [36]
 Left message following up with patient as outlined by Dr.Nagpal. contact information provided

## 2024-06-09 NOTE — Telephone Encounter [36]
 Spoke with patient, understanding of the plan and will call to schedule the stress test.

## 2024-06-09 NOTE — Telephone Encounter [36]
 Patient returning call to Clotilda Quivers.

## 2024-07-14 ENCOUNTER — Encounter: Admit: 2024-07-14 | Payer: PRIVATE HEALTH INSURANCE | Attending: Cardiovascular Disease | Primary: Internal Medicine

## 2024-07-14 ENCOUNTER — Inpatient Hospital Stay: Admit: 2024-07-14 | Discharge: 2024-07-14 | Payer: PRIVATE HEALTH INSURANCE | Primary: Internal Medicine

## 2024-07-14 ENCOUNTER — Inpatient Hospital Stay: Admit: 2024-07-14 | Discharge: 2024-07-14 | Payer: BLUE CROSS/BLUE SHIELD | Primary: Internal Medicine

## 2024-07-14 DIAGNOSIS — I2510 Atherosclerotic heart disease of native coronary artery without angina pectoris: Principal | ICD-10-CM

## 2024-07-14 MED ORDER — TECHNETIUM TC-99M TETROFOSMIN INJECTION
Freq: Once | INTRAVENOUS | Status: CP
Start: 2024-07-14 — End: ?
  Administered 2024-07-14: 11:00:00 via INTRAVENOUS

## 2024-07-14 MED ORDER — TECHNETIUM TC-99M TETROFOSMIN INJECTION
Freq: Once | INTRAVENOUS | Status: CP
Start: 2024-07-14 — End: ?
  Administered 2024-07-14: 13:00:00 via INTRAVENOUS

## 2024-07-15 ENCOUNTER — Ambulatory Visit: Admit: 2024-07-15 | Payer: PRIVATE HEALTH INSURANCE | Attending: Cardiovascular Disease | Primary: Internal Medicine

## 2024-07-15 VITALS — BP 110/68 | HR 55 | Ht 71.0 in | Wt 206.0 lb

## 2024-07-15 DIAGNOSIS — I429 Cardiomyopathy, unspecified: Principal | ICD-10-CM

## 2024-07-26 ENCOUNTER — Ambulatory Visit: Admit: 2024-07-26 | Payer: BLUE CROSS/BLUE SHIELD | Attending: Nephrology | Primary: Internal Medicine

## 2024-07-26 ENCOUNTER — Inpatient Hospital Stay: Admit: 2024-07-26 | Discharge: 2024-07-26 | Payer: BLUE CROSS/BLUE SHIELD | Primary: Internal Medicine

## 2024-07-26 DIAGNOSIS — M1000 Idiopathic gout, unspecified site: Secondary | ICD-10-CM

## 2024-07-26 DIAGNOSIS — N184 Chronic kidney disease, stage 4 (severe): Secondary | ICD-10-CM

## 2024-07-26 LAB — PHOSPHORUS     (BH GH L LMW YH): BKR PHOSPHORUS: 4 mg/dL (ref 2.2–4.5)

## 2024-12-29 ENCOUNTER — Ambulatory Visit: Admit: 2024-12-29 | Payer: PRIVATE HEALTH INSURANCE | Attending: Cardiovascular Disease | Primary: Internal Medicine
